# Patient Record
Sex: Female | Born: 1955
Health system: Southern US, Community
[De-identification: ages and names within clinical notes are randomized; demographics above are authoritative.]

## PROBLEM LIST (undated history)

## (undated) DIAGNOSIS — I10 Essential (primary) hypertension: Secondary | ICD-10-CM

## (undated) DIAGNOSIS — Z9889 Other specified postprocedural states: Secondary | ICD-10-CM

## (undated) DIAGNOSIS — E079 Disorder of thyroid, unspecified: Secondary | ICD-10-CM

## (undated) DIAGNOSIS — J45909 Unspecified asthma, uncomplicated: Secondary | ICD-10-CM

## (undated) DIAGNOSIS — E039 Hypothyroidism, unspecified: Secondary | ICD-10-CM

## (undated) DIAGNOSIS — R112 Nausea with vomiting, unspecified: Secondary | ICD-10-CM

## (undated) HISTORY — PX: BUNIONECTOMY: SHX129

## (undated) HISTORY — DX: Unspecified asthma, uncomplicated: J45.909

## (undated) HISTORY — PX: ABDOMINAL HYSTERECTOMY: SHX81

## (undated) HISTORY — PX: APPENDECTOMY: SHX54

---

## 2000-06-11 ENCOUNTER — Other Ambulatory Visit: Admission: RE | Admit: 2000-06-11 | Discharge: 2000-06-11 | Payer: Self-pay | Admitting: Obstetrics and Gynecology

## 2001-12-17 ENCOUNTER — Other Ambulatory Visit: Admission: RE | Admit: 2001-12-17 | Discharge: 2001-12-17 | Payer: Self-pay | Admitting: Unknown Physician Specialty

## 2004-02-04 ENCOUNTER — Emergency Department (HOSPITAL_COMMUNITY): Admission: EM | Admit: 2004-02-04 | Discharge: 2004-02-04 | Payer: Self-pay | Admitting: Family Medicine

## 2006-11-02 ENCOUNTER — Emergency Department (HOSPITAL_COMMUNITY): Admission: EM | Admit: 2006-11-02 | Discharge: 2006-11-02 | Payer: Self-pay | Admitting: Family Medicine

## 2007-04-15 ENCOUNTER — Ambulatory Visit: Payer: Self-pay | Admitting: Internal Medicine

## 2007-04-29 ENCOUNTER — Ambulatory Visit (HOSPITAL_COMMUNITY): Admission: RE | Admit: 2007-04-29 | Discharge: 2007-04-29 | Payer: Self-pay | Admitting: Internal Medicine

## 2007-04-29 ENCOUNTER — Encounter: Payer: Self-pay | Admitting: Internal Medicine

## 2007-04-29 ENCOUNTER — Ambulatory Visit: Payer: Self-pay | Admitting: Internal Medicine

## 2007-04-29 HISTORY — PX: OTHER SURGICAL HISTORY: SHX169

## 2009-07-04 ENCOUNTER — Ambulatory Visit (HOSPITAL_COMMUNITY): Admission: RE | Admit: 2009-07-04 | Discharge: 2009-07-04 | Payer: Self-pay | Admitting: Internal Medicine

## 2010-06-19 NOTE — Op Note (Signed)
Heather Raymond, Heather Raymond                ACCOUNT NO.:  [id number]   MEDICAL RECORD NO.:  [id number]          PATIENT TYPE:  AMB   LOCATION:  DAY                           FACILITY:  [facility]   PHYSICIAN:  R. Roetta Sessions, M.D. DATE OF BIRTH:  [birth year]   DATE OF PROCEDURE:  04/29/2007  DATE OF DISCHARGE:                               OPERATIVE REPORT   PROCEDURE:  Ileocolonoscopy with biopsy, snare polypectomy.   INDICATIONS FOR PROCEDURE:  A 55 year old lady with intermittent small-  volume hematochezia that is painless.  She has never had her lower GI  tract evaluated.  There is no family history of colorectal neoplasia.  Colonoscopy is now being done.  This approach has been discussed with  the patient at length, potential risks, benefits and alternatives have  been reviewed, questions answered.  She is agreeable.  Please see  documentation in the medical record.   PROCEDURE NOTE:  O2 saturation, blood pressure, pulse and respirations  were throughout entirety of the procedure.  Conscious sedation:  Versed  4 mg IV, Demerol 75 mg IV in divided doses. Instrument:  Pentax video  chip system.   FINDINGS:  Digital rectal exam revealed no abnormalities.  Endoscopic  findings:  The prep was adequate.   Colon:  The colonic mucosa was surveyed from the rectosigmoid junction  through the left, transverse and right colon to area of the appendiceal  orifice, ileocecal valve and cecum.  These structures were well-seen and  photographed for the record.  The terminal ileum was intubated to 810  cm.  From this level the scope was slowly withdrawn.  All previously-  mentioned mucosal surfaces were again seen.  The patient was noted have  a 2-mm polyp in the base the cecum, which was totally removed with cold  biopsy forceps and recovered for the pathologist.  The terminal ileum  appeared normal.  The patient was noted have left-sided diverticula.  There was a 6-mm flat polyp in the mid sigmoid colon,  which was removed  with hot snare cautery and recovered through the scope.  Remainder of  the colonic mucosa appeared normal.  The scope was pulled down into the  rectum, where a thorough examination of the rectal mucosa including an  en face view of the anal canal demonstrated some friable hemorrhoids  only.  The patient tolerated the procedure well, was reacted in  endoscopy.   IMPRESSION:  1. Friable anal canal hemorrhoids, otherwise normal rectum.  2. Left-sided diverticula.  3. Flat polyp, mid sigmoid colon, removed as described above.  4. Diminutive cecal polyp removed as described above.  5. Remainder of the colonic mucosa appeared normal.  6. Normal terminal ileum.   RECOMMENDATIONS:  1. Hemorrhoid, diverticulosis and polyp literature provided to [name].  Begin Anusol-HC suppositories one per rectum at bedtime      times 10 days.  2. Fiber supplement in the way of Benefiber 1 tablespoon daily.  3. Follow up on pathology.  4. Further recommendations to follow.      Jonathon Bellows, M.D.  Electronically Signed     RMR/MEDQ  D:  04/29/2007  T:  04/29/2007  Job:  [id number]   cc:   Catalina Pizza, M.D.  Fax: [phone]

## 2010-06-19 NOTE — Consult Note (Signed)
NAMEAGAPE, HARDIMAN                ACCOUNT NO.:  [id number]   MEDICAL RECORD NO.:  [id number]          PATIENT TYPE:  AMB   LOCATION:  DAY                           FACILITY:  [facility]   PHYSICIAN:  R. Roetta Sessions, M.D. DATE OF BIRTH:  1955/03/09   DATE OF CONSULTATION:  DATE OF DISCHARGE:                                 CONSULTATION   REQUESTING PHYSICIAN:  Catalina Pizza, M.D.   PRIMARY CARE PHYSICIAN:  R. Roetta Sessions, M.D.   CONSULTATIONS:  GI.   CHIEF COMPLAINT:  Intermittent rectal bleeding.   HISTORY OF PRESENT ILLNESS:  Ms. Buchinger is a 55 year old female who has  never had a colonoscopy.  She presented for triage and colonoscopy.  Upon further questioning, it was noted that she has been having  intermittent rectal bleeding.  She was then scheduled for office  consultation prior to procedure.  She notes small volume intermittent  rectal bleeding that has been going on for approximately 20 years.  She  notes it usually when she has hard stools.  She attributes this to  hemorrhoids.  She has had them since she was pregnant with her children  20+ years ago.  She denies any active bleeding at this time.  She denies  any abdominal pain, constipation or diarrhea.  Denies any nausea,  vomiting, heartburn or indigestion.   PAST MEDICAL/SURGICAL HISTORY:  1. History of hypertension.  2. Appendectomy in 2002.  3. She has had a partial hysterectomy in 2004.   CURRENT MEDICATIONS:  1. Benicar 20 mg daily.  2. Calcium and vitamin D once daily.   ALLERGIES:  NO KNOWN DRUG ALLERGIES.   FAMILY HISTORY:  She had a grandmother with gastric carcinoma.  Father  deceased at 65 secondary to multiple myeloma.  Mother age 14 has history  of osteoporosis.  She has 4 healthy siblings.   SOCIAL HISTORY:  Ms. Viramontes is married.  She lost 1 son secondary to an  accident.  She has 1 child who is alive and well.  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  She denies any tobacco,  alcohol or drug use.   REVIEW OF SYSTEMS:  See HPI, otherwise negative.   PHYSICAL EXAMINATION:  VITAL SIGNS:  Weight 154 pounds, height 63  inches, temperature 98.2, blood pressure 120/80, pulse 80.  GENERAL:  Ms. Hancock is a well-developed, well-nourished Caucasian female  in no acute distress.  HEENT:  Sclerae are clear and nonicteric.  Clear conjunctivae.  Oropharynx pink and moist without lesions.  NECK:  Supple without thyromegaly.  CHEST:  Heart regular rate and rhythm.  Normal S1-S2.  No rubs, murmurs,  clicks, rubs or gallops.  LUNGS:  Clear to auscultation bilaterally.  ABDOMEN:  Positive bowel sounds x4.  No bruits auscultated.  Soft,  nontender, nondistended without palpable mass or hepatosplenomegaly.  No  rebound tenderness or guarding.  EXTREMITIES:  Without clubbing or edema bilaterally.  RECTAL:  Deferred.   IMPRESSION:  Ms. Roe is a 55 year old female with intermittent small  volume rectal bleeding of a chronic nature felt to be  secondary to  hemorrhoids, however, she has never had a colonoscopy.  She is going to  need further evaluation to rule out colorectal carcinoma.   PLAN:  Colonoscopy with Dr. Jena Gauss in the future.  I have  discussed the  procedure including risks and benefits, including but not limited to  bleeding, infection, perforation, drug reaction.  She agrees with the  plan and consent will be obtained.   Thank you Dr. Margo Aye for allowing [place] to participate in the care of Ms.  Corbit.      Lorenza Burton, N.P.      Jonathon Bellows, M.D.  Electronically Signed    KJ/MEDQ  D:  04/15/2007  T:  04/16/2007  Job:  [id number]   cc:   Catalina Pizza, M.D.  Fax: [phone]

## 2010-09-10 ENCOUNTER — Ambulatory Visit (HOSPITAL_COMMUNITY)
Admission: RE | Admit: 2010-09-10 | Discharge: 2010-09-10 | Disposition: A | Discharge status: Home / Self Care | Payer: No Typology Code available for payment source | Source: Ambulatory Visit | Attending: Internal Medicine | Admitting: Internal Medicine

## 2010-09-10 ENCOUNTER — Other Ambulatory Visit (HOSPITAL_COMMUNITY): Payer: Self-pay | Admitting: Internal Medicine

## 2010-09-10 DIAGNOSIS — R072 Precordial pain: Secondary | ICD-10-CM | POA: Insufficient documentation

## 2010-11-15 LAB — POCT URINALYSIS DIP (DEVICE)
Bilirubin Urine: NEGATIVE
Ketones, ur: NEGATIVE
Nitrite: NEGATIVE
Operator id: 235561
Protein, ur: 30 — AB
Specific Gravity, Urine: 1.01

## 2010-11-15 LAB — URINE CULTURE

## 2011-12-17 ENCOUNTER — Emergency Department (HOSPITAL_COMMUNITY)
Admission: EM | Admit: 2011-12-17 | Discharge: 2011-12-17 | Disposition: A | Discharge status: Home / Self Care | Payer: BC Managed Care – PPO | Attending: Emergency Medicine | Admitting: Emergency Medicine

## 2011-12-17 ENCOUNTER — Encounter (HOSPITAL_COMMUNITY): Payer: Self-pay

## 2011-12-17 DIAGNOSIS — Z79899 Other long term (current) drug therapy: Secondary | ICD-10-CM | POA: Insufficient documentation

## 2011-12-17 DIAGNOSIS — R197 Diarrhea, unspecified: Secondary | ICD-10-CM | POA: Insufficient documentation

## 2011-12-17 DIAGNOSIS — R5383 Other fatigue: Secondary | ICD-10-CM | POA: Insufficient documentation

## 2011-12-17 DIAGNOSIS — R5381 Other malaise: Secondary | ICD-10-CM | POA: Insufficient documentation

## 2011-12-17 DIAGNOSIS — R112 Nausea with vomiting, unspecified: Secondary | ICD-10-CM | POA: Insufficient documentation

## 2011-12-17 DIAGNOSIS — I10 Essential (primary) hypertension: Secondary | ICD-10-CM | POA: Insufficient documentation

## 2011-12-17 DIAGNOSIS — E079 Disorder of thyroid, unspecified: Secondary | ICD-10-CM | POA: Insufficient documentation

## 2011-12-17 HISTORY — DX: Disorder of thyroid, unspecified: E07.9

## 2011-12-17 HISTORY — DX: Essential (primary) hypertension: I10

## 2011-12-17 LAB — CBC WITH DIFFERENTIAL/PLATELET
Eosinophils Absolute: 0 10*3/uL (ref 0.0–0.7)
Eosinophils Relative: 0 % (ref 0–5)
Hemoglobin: 14.6 g/dL (ref 12.0–15.0)
MCH: 30.9 pg (ref 26.0–34.0)
MCHC: 34 g/dL (ref 30.0–36.0)
Monocytes Absolute: 0.5 10*3/uL (ref 0.1–1.0)
Monocytes Relative: 5 % (ref 3–12)
Platelets: 244 10*3/uL (ref 150–400)
WBC: 10 10*3/uL (ref 4.0–10.5)

## 2011-12-17 LAB — BASIC METABOLIC PANEL
BUN: 13 mg/dL (ref 6–23)
GFR calc Af Amer: >90 mL/min (ref >90)
GFR calc non Af Amer: >90 mL/min (ref >90)
Glucose, Bld: 113 mg/dL — ABNORMAL HIGH (ref 70–99)
Sodium: 139 mEq/L (ref 135–145)

## 2011-12-17 MED ORDER — ONDANSETRON HCL 4 MG/2ML IJ SOLN
4.0000 mg | Freq: Once | INTRAMUSCULAR | Status: AC
  Administered 2011-12-17: 4 mg via INTRAVENOUS
  Filled 2011-12-17: qty 2

## 2011-12-17 MED ORDER — SODIUM CHLORIDE 0.9 % IV SOLN: Freq: Once | INTRAVENOUS | Status: DC

## 2011-12-17 MED ORDER — SODIUM CHLORIDE 0.9 % IV BOLUS (SEPSIS)
1000.0000 mL | Freq: Once | INTRAVENOUS | Status: AC
  Administered 2011-12-17: 1000 mL via INTRAVENOUS

## 2011-12-17 NOTE — ED Notes (Signed)
Pt reports woke up this morning with abd pain, n/v/d at 4 am.

## 2011-12-17 NOTE — ED Provider Notes (Signed)
History    This chart was scribed for Joya Gaskins, MD, MD by Smitty Pluck, ED Scribe. The patient was seen in room APA08 and the patient's care was started at 12:42PM.   CSN: [id number]  Arrival date & time 12/17/11  1138      Chief Complaint  Patient presents with  . Abdominal Pain  . Emesis  . Diarrhea     Patient is a 56 y.o. female presenting with abdominal pain, vomiting, and diarrhea. The history is provided by the patient. No language interpreter was used.  Abdominal Pain The primary symptoms of the illness include abdominal pain, vomiting and diarrhea. The primary symptoms of the illness do not include fever or shortness of breath. The problem has been gradually improving.  The abdominal pain began 6 to 12 hours ago. The pain came on suddenly. The abdominal pain is generalized. The abdominal pain does not radiate. The abdominal pain is relieved by nothing.  The vomiting began today.  The diarrhea began today. The diarrhea is watery. The diarrhea occurs more than 10 times per day.  Symptoms associated with the illness do not include chills.  Emesis  Associated symptoms include abdominal pain and diarrhea. Pertinent negatives include no chills, no cough, no fever and no headaches.  Diarrhea The primary symptoms include abdominal pain, vomiting and diarrhea. Primary symptoms do not include fever.  The illness does not include chills.   Heather Raymond is a 56 y.o. female who presents to the Emergency Department complaining of constant, moderate abdominal pain onset today 9 hours ago. Pt reports that she woke up with abdominal cramping, diarrhea 12x and vomiting. She reports that she had mild pain 3 days ago. Denies fevers, chills, blood in stool, abx usage, recent travel, cough, SOB, dietary changes, chest pain, back pain and syncope. She reports that she has generalized weakness. Reports abdominal pain has improved.   Past Medical History  Diagnosis Date  . Hypertension    . Thyroid disease     Past Surgical History  Procedure Date  . Abdominal hysterectomy   . Appendectomy     No family history on file.  History  Substance Use Topics  . Smoking status: Never Smoker   . Smokeless tobacco: Not on file  . Alcohol Use: No    OB History    Grav Para Term Preterm Abortions TAB SAB Ect Mult Living                  Review of Systems  Constitutional: Negative for fever and chills.  Respiratory: Negative for cough and shortness of breath.   Cardiovascular: Negative for chest pain.  Gastrointestinal: Positive for vomiting, abdominal pain and diarrhea.  Neurological: Positive for weakness. Negative for syncope and headaches.  All other systems reviewed and are negative.    Allergies  Sulfa antibiotics  Home Medications   Current Outpatient Rx  Name  Route  Sig  Dispense  Refill  . LEVOTHYROXINE SODIUM 25 MCG PO TABS   Oral   Take 25 mcg by mouth daily.         Marland Kitchen LORATADINE 10 MG PO TABS   Oral   Take 10 mg by mouth daily.         Jeralyn Bennett CALCIUM 500 MG PO TABS   Oral   Take 500 mg of elemental calcium by mouth 2 (two) times daily.         . TELMISARTAN 80 MG PO TABS   Oral  Take 80 mg by mouth daily.           BP 115/88  Pulse 89  Temp 98.4 F (36.9 C) (Oral)  Resp 18  Ht 5\' 3"  (1.6 m)  Wt 145 lb (65.772 kg)  BMI 25.69 kg/m2  SpO2 99%  Physical Exam  Nursing note and vitals reviewed. CONSTITUTIONAL: Well developed/well nourished HEAD AND FACE: Normocephalic/atraumatic EYES: EOMI/PERRL, no icterus ENMT: Mucous membranes dry NECK: supple no meningeal signs SPINE:entire spine nontender CV: S1/S2 noted, no murmurs/rubs/gallops noted LUNGS: Lungs are clear to auscultation bilaterally, no apparent distress ABDOMEN: soft, nontender, no rebound or guarding GU:no cva tenderness NEURO: Pt is awake/alert, moves all extremitiesx4 EXTREMITIES: pulses normal, full ROM SKIN: warm, color normal PSYCH: no  abnormalities of mood noted   ED Course  Procedures  DIAGNOSTIC STUDIES: Oxygen Saturation is 99% on room air, normal by my interpretation.    COORDINATION OF CARE: 12:45 PM Discussed ED treatment with pt   2:37 PM Pt improved,she is taking PO fluids.  Her abdomen is soft and nontender.  She reported both vomit and diarrhea, likely viral process.  Discussed strict return precautions    MDM  Nursing notes including past medical history and social history reviewed and considered in documentation Labs/vital reviewed and considered     I personally performed the services described in this documentation, which was scribed in my presence. The recorded information has been reviewed and is accurate.      Joya Gaskins, MD 12/17/11 1438

## 2011-12-17 NOTE — ED Notes (Signed)
Pt states awoke this morning at 0400 to abdominal cramping, diarrhea and vomiting. Pt states vomited x 3 today and several diarrhea episodes. Pt reports feeling fine yesterday.  Pt denies fever at this time.  However does report chills and body aches. Pt reports having a flu shot 1 week ago.

## 2012-02-26 ENCOUNTER — Encounter: Payer: Self-pay | Admitting: Internal Medicine

## 2012-04-02 ENCOUNTER — Encounter: Payer: Self-pay | Admitting: Internal Medicine

## 2012-04-06 ENCOUNTER — Ambulatory Visit: Payer: BC Managed Care – PPO | Admitting: Urgent Care

## 2012-04-28 ENCOUNTER — Ambulatory Visit: Payer: BC Managed Care – PPO | Admitting: Gastroenterology

## 2012-05-03 ENCOUNTER — Emergency Department (HOSPITAL_COMMUNITY)
Admission: EM | Admit: 2012-05-03 | Discharge: 2012-05-03 | Disposition: A | Discharge status: Home / Self Care | Payer: BC Managed Care – PPO | Source: Home / Self Care | Attending: Family Medicine | Admitting: Family Medicine

## 2012-05-03 ENCOUNTER — Encounter (HOSPITAL_COMMUNITY): Payer: Self-pay | Admitting: *Deleted

## 2012-05-03 DIAGNOSIS — J309 Allergic rhinitis, unspecified: Secondary | ICD-10-CM

## 2012-05-03 DIAGNOSIS — J302 Other seasonal allergic rhinitis: Secondary | ICD-10-CM

## 2012-05-03 MED ORDER — FLUTICASONE PROPIONATE 50 MCG/ACT NA SUSP: 1.0000 | Freq: Two times a day (BID) | NASAL | Status: DC

## 2012-05-03 MED ORDER — AZITHROMYCIN 250 MG PO TABS: ORAL_TABLET | ORAL | Status: DC

## 2012-05-03 NOTE — ED Notes (Signed)
C/O HAs, sinus pressure and drainage, dry cough x 3 days.  Felt feverish and had chills last night.  Has been taking Mucinex, Nyquil, and Tyl (last dose @ 1000).  BBS clear.  Denies any SOB.

## 2012-05-03 NOTE — ED Provider Notes (Signed)
History     CSN: [id number]  Arrival date & time 05/03/12  1100   First MD Initiated Contact with Patient 05/03/12 1103      Chief Complaint  Patient presents with  . Facial Pain  . Nasal Congestion    (Consider location/radiation/quality/duration/timing/severity/associated sxs/prior treatment) Patient is a 57 y.o. female presenting with URI. The history is provided by the patient.  URI Presenting symptoms: congestion, cough, facial pain, fever and rhinorrhea   Severity:  Mild Duration:  3 days Progression:  Unchanged Chronicity:  Recurrent Ineffective treatments:  Decongestant Associated symptoms: no wheezing   Risk factors comment:  Seasonal allergies   Past Medical History  Diagnosis Date  . Hypertension   . Thyroid disease     Past Surgical History  Procedure Laterality Date  . Abdominal hysterectomy    . Appendectomy    . Leocolonoscopy  04/29/2007    GNF:AOZHYQM anal canal hemorrhoids/Left-sided diverticula/Flat polyp, mid sigmoid colon  . Bunionectomy      March 2014    No family history on file.  History  Substance Use Topics  . Smoking status: Never Smoker   . Smokeless tobacco: Not on file  . Alcohol Use: No    OB History   Grav Para Term Preterm Abortions TAB SAB Ect Mult Living                  Review of Systems  Constitutional: Positive for fever and chills.  HENT: Positive for congestion, rhinorrhea and postnasal drip.   Respiratory: Positive for cough. Negative for shortness of breath and wheezing.   Cardiovascular: Negative.     Allergies  Sulfa antibiotics  Home Medications   Current Outpatient Rx  Name  Route  Sig  Dispense  Refill  . Calcium Carbonate-Vitamin D (CALCIUM + D PO)   Oral   Take by mouth.         . levothyroxine (SYNTHROID, LEVOTHROID) 25 MCG tablet   Oral   Take 25 mcg by mouth daily.         Marland Kitchen telmisartan (MICARDIS) 80 MG tablet   Oral   Take 80 mg by mouth daily.         Marland Kitchen azithromycin  (ZITHROMAX Z-PAK) 250 MG tablet      Take as directed on pack   6 each   0   . fluticasone (FLONASE) 50 MCG/ACT nasal spray   Nasal   Place 1 spray into the nose 2 (two) times daily.   1 g   2   . loratadine (CLARITIN) 10 MG tablet   Oral   Take 10 mg by mouth daily.         Ethelda Chick (OYSTER CALCIUM) 500 MG TABS   Oral   Take 500 mg of elemental calcium by mouth 2 (two) times daily.           BP 114/78  Pulse 104  Temp(Src) 98.2 F (36.8 C) (Oral)  Resp 18  SpO2 95%  Physical Exam  Nursing note and vitals reviewed. Constitutional: She is oriented to person, place, and time. She appears well-developed and well-nourished.  HENT:  Head: Normocephalic.  Right Ear: External ear normal.  Left Ear: External ear normal.  Nose: Mucosal edema and rhinorrhea present.  Mouth/Throat: Oropharynx is clear and moist.  Neck: Normal range of motion. Neck supple.  Cardiovascular: Normal rate, regular rhythm, normal heart sounds and intact distal pulses.   Pulmonary/Chest: Effort normal and breath sounds normal.  Lymphadenopathy:    She has no cervical adenopathy.  Neurological: She is alert and oriented to person, place, and time.  Skin: Skin is warm and dry.    ED Course  Procedures (including critical care time)  Labs Reviewed - No data to display No results found.   1. Seasonal allergic rhinitis       MDM          Linna Hoff, MD 05/05/12 2047

## 2012-06-01 ENCOUNTER — Other Ambulatory Visit: Payer: Self-pay

## 2012-06-01 ENCOUNTER — Ambulatory Visit (INDEPENDENT_AMBULATORY_CARE_PROVIDER_SITE_OTHER): Payer: BC Managed Care – PPO | Admitting: Gastroenterology

## 2012-06-01 ENCOUNTER — Encounter: Payer: Self-pay | Admitting: Gastroenterology

## 2012-06-01 VITALS — BP 128/76 | HR 80 | Temp 97.2°F | Ht 64.0 in | Wt 148.4 lb

## 2012-06-01 DIAGNOSIS — Z1211 Encounter for screening for malignant neoplasm of colon: Secondary | ICD-10-CM

## 2012-06-01 DIAGNOSIS — Z86010 Personal history of colonic polyps: Secondary | ICD-10-CM

## 2012-06-01 MED ORDER — PEG-KCL-NACL-NASULF-NA ASC-C 100 G PO SOLR: 1.0000 | ORAL | Status: DC

## 2012-06-01 NOTE — Addendum Note (Signed)
Addended by: Lavena Bullion on: 06/01/2012 11:59 AM   Modules accepted: Orders

## 2012-06-01 NOTE — Patient Instructions (Addendum)
We have scheduled you for a colonoscopy. Please see separate instructions. 

## 2012-06-01 NOTE — Progress Notes (Signed)
Primary Care Physician:  HALL,ZACK, MD  Primary Gastroenterologist:  Michael Rourk, MD   Chief Complaint  Patient presents with  . Colonoscopy    HPI:  Heather Raymond is a 56 y.o. female here to schedule surveillance colonoscopy. In 2009 she had a tubular adenoma removed from the cecum. BM better on levothyroxine. No melena. Some brbpr with norovirus back in the fall. Due to hemorrhoids. Hemoglobin normal at that time. No abdominal pain. Good appetite. No heartburn, dysphagia. No weight loss.   Current Outpatient Prescriptions  Medication Sig Dispense Refill  . Calcium Carbonate-Vitamin D (CALCIUM + D PO) Take by mouth.      . fluticasone (FLONASE) 50 MCG/ACT nasal spray Place 1 spray into the nose 2 (two) times daily.  1 g  2  . levothyroxine (SYNTHROID, LEVOTHROID) 25 MCG tablet Take 25 mcg by mouth daily.      . loratadine (CLARITIN) 10 MG tablet Take 10 mg by mouth daily.      . telmisartan (MICARDIS) 80 MG tablet Take 80 mg by mouth daily.       No current facility-administered medications for this visit.    Allergies as of 06/01/2012 - Review Complete 06/01/2012  Allergen Reaction Noted  . Sulfa antibiotics  12/17/2011    Past Medical History  Diagnosis Date  . Hypertension   . Thyroid disease     Past Surgical History  Procedure Laterality Date  . Abdominal hysterectomy    . Appendectomy    . Leocolonoscopy  04/29/2007    RMR:Friable anal canal hemorrhoids/Left-sided diverticula/Flat polyp, mid sigmoid colon (hyperplastic), diminutive cecal polyp (tubular adenoma)  . Bunionectomy      March 2014    Family History  Problem Relation Age of Onset  . Gastric cancer Other     Grandmother  . Multiple myeloma Father     Deceased at age 77  . Colon cancer Neg Hx     History   Social History  . Marital Status: Married    Spouse Name: [name]    Number of Children: [name]  . Years of Education: [name]   Occupational History  . Retired clerk of court    Social  History Main Topics  . Smoking status: Never Smoker   . Smokeless tobacco: Not on file  . Alcohol Use: No  . Drug Use: No  . Sexually Active: Not on file   Other Topics Concern  . Not on file   Social History Narrative   One child living. One son deceased secondary to accident.      ROS:  General: Negative for anorexia, weight loss, fever, chills, fatigue, weakness. Eyes: Negative for vision changes.  ENT: Negative for hoarseness, difficulty swallowing , nasal congestion. CV: Negative for chest pain, angina, palpitations, dyspnea on exertion, peripheral edema.  Respiratory: Negative for dyspnea at rest, dyspnea on exertion, cough, sputum, wheezing.  GI: See history of present illness. GU:  Negative for dysuria, hematuria, urinary incontinence, urinary frequency, nocturnal urination.  MS: Negative for joint pain, low back pain. Left foot swelling since bunionectomy Derm: Negative for rash or itching.  Neuro: Negative for weakness, abnormal sensation, seizure, frequent headaches, memory loss, confusion.  Psych: Negative for anxiety, depression, suicidal ideation, hallucinations.  Endo: Negative for unusual weight change.  Heme: Negative for bruising or bleeding. Allergy: Negative for rash or hives.    Physical Examination:  BP 128/76  Pulse 80  Temp(Src) 97.2 F (36.2 C) (Oral)  Ht 5' 4" (1.626 m)    Wt 148 lb 6.4 oz (67.314 kg)  BMI 25.46 kg/m2   General: Well-nourished, well-developed in no acute distress.  Head: Normocephalic, atraumatic.   Eyes: Conjunctiva pink, no icterus. Mouth: Oropharyngeal mucosa moist and pink , no lesions erythema or exudate. Neck: Supple without thyromegaly, masses, or lymphadenopathy.  Lungs: Clear to auscultation bilaterally.  Heart: Regular rate and rhythm, no murmurs rubs or gallops.  Abdomen: Bowel sounds are normal, nontender, nondistended, no hepatosplenomegaly or masses, no abdominal bruits or    hernia , no rebound or guarding.    Rectal: Deferred Extremities: No lower extremity edema. No clubbing or deformities.  Neuro: Alert and oriented x 4 , grossly normal neurologically.  Skin: Warm and dry, no rash or jaundice.   Psych: Alert and cooperative, normal mood and affect.  Labs:  Lab Results  Component Value Date   WBC 10.0 12/17/2011   HGB 14.6 12/17/2011   HCT 42.9 12/17/2011   MCV 90.7 12/17/2011   PLT 244 12/17/2011   Lab Results  Component Value Date   CREATININE 0.62 12/17/2011   BUN 13 12/17/2011   NA 139 12/17/2011   K 3.9 12/17/2011   CL 103 12/17/2011   CO2 26 12/17/2011    Imaging Studies: No results found.    

## 2012-06-01 NOTE — Progress Notes (Signed)
Cc PCP 

## 2012-06-01 NOTE — Assessment & Plan Note (Addendum)
57 year old lady with history of tubular adenoma removed from the cecum back in 2009 who presents for surveillance colonoscopy. No GI symptoms at this time. Colonoscopy in the near future with Dr. Jena Gauss.  I have discussed the risks, alternatives, benefits with regards to but not limited to the risk of reaction to medication, bleeding, infection, perforation and the patient is agreeable to proceed. Written consent to be obtained.

## 2012-06-11 ENCOUNTER — Encounter (HOSPITAL_COMMUNITY): Payer: Self-pay | Admitting: Pharmacy Technician

## 2012-06-30 ENCOUNTER — Encounter (HOSPITAL_COMMUNITY): Payer: Self-pay | Admitting: *Deleted

## 2012-06-30 ENCOUNTER — Ambulatory Visit (HOSPITAL_COMMUNITY)
Admission: RE | Admit: 2012-06-30 | Discharge: 2012-06-30 | Disposition: A | Discharge status: Home / Self Care | Payer: BC Managed Care – PPO | Source: Ambulatory Visit | Attending: Internal Medicine | Admitting: Internal Medicine

## 2012-06-30 ENCOUNTER — Encounter (HOSPITAL_COMMUNITY)
Admission: RE | Disposition: A | Discharge status: Home / Self Care | Payer: Self-pay | Source: Ambulatory Visit | Attending: Internal Medicine

## 2012-06-30 DIAGNOSIS — K5730 Diverticulosis of large intestine without perforation or abscess without bleeding: Secondary | ICD-10-CM | POA: Insufficient documentation

## 2012-06-30 DIAGNOSIS — D126 Benign neoplasm of colon, unspecified: Secondary | ICD-10-CM | POA: Insufficient documentation

## 2012-06-30 DIAGNOSIS — Z1211 Encounter for screening for malignant neoplasm of colon: Secondary | ICD-10-CM

## 2012-06-30 DIAGNOSIS — Z860100 Personal history of colon polyps, unspecified: Secondary | ICD-10-CM | POA: Insufficient documentation

## 2012-06-30 DIAGNOSIS — I10 Essential (primary) hypertension: Secondary | ICD-10-CM | POA: Insufficient documentation

## 2012-06-30 HISTORY — PX: COLONOSCOPY: SHX5424

## 2012-06-30 SURGERY — COLONOSCOPY
Anesthesia: Moderate Sedation

## 2012-06-30 MED ORDER — MIDAZOLAM HCL 5 MG/5ML IJ SOLN
INTRAMUSCULAR | Status: AC
  Filled 2012-06-30: qty 10

## 2012-06-30 MED ORDER — SODIUM CHLORIDE 0.9 % IV SOLN
INTRAVENOUS | Status: DC
  Administered 2012-06-30: 08:00:00 via INTRAVENOUS

## 2012-06-30 MED ORDER — MEPERIDINE HCL 100 MG/ML IJ SOLN
INTRAMUSCULAR | Status: DC | PRN
  Administered 2012-06-30 (×2): 50 mg via INTRAVENOUS

## 2012-06-30 MED ORDER — STERILE WATER FOR IRRIGATION IR SOLN
Status: DC | PRN
  Administered 2012-06-30: 08:00:00

## 2012-06-30 MED ORDER — MIDAZOLAM HCL 5 MG/5ML IJ SOLN
INTRAMUSCULAR | Status: DC | PRN
  Administered 2012-06-30 (×2): 2 mg via INTRAVENOUS

## 2012-06-30 MED ORDER — MEPERIDINE HCL 100 MG/ML IJ SOLN
INTRAMUSCULAR | Status: AC
  Filled 2012-06-30: qty 1

## 2012-06-30 MED ORDER — ONDANSETRON HCL 4 MG/2ML IJ SOLN
INTRAMUSCULAR | Status: DC | PRN
  Administered 2012-06-30: 4 mg via INTRAVENOUS

## 2012-06-30 MED ORDER — ONDANSETRON HCL 4 MG/2ML IJ SOLN
INTRAMUSCULAR | Status: AC
  Filled 2012-06-30: qty 2

## 2012-06-30 NOTE — Op Note (Signed)
[facility] [address] [place], [address]   COLONOSCOPY PROCEDURE REPORT  PATIENT: Heather Raymond, Heather Raymond  MR#:         [id number] BIRTHDATE: 18-Sep-1955 , 56  yrs. old GENDER: Female ENDOSCOPIST: R.  Roetta Sessions, MD FACP FACG REFERRED BY:  Catalina Pizza, M.D. PROCEDURE DATE:  06/30/2012 PROCEDURE:     Colonoscopy with snare polypectomy  INDICATIONS: History of colonic adenoma  INFORMED CONSENT:  The risks, benefits, alternatives and imponderables including but not limited to bleeding, perforation as well as the possibility of a missed lesion have been reviewed.  The potential for biopsy, lesion removal, etc. have also been discussed.  Questions have been answered.  All parties agreeable. Please see the history and physical in the medical record for more information.  MEDICATIONS: Versed 4 mg IV and Demerol 100 mg IV in divided doses. Zofran 4 mg IV  DESCRIPTION OF PROCEDURE:  After a digital rectal exam was performed, the EC-[id number] ([id number])  colonoscope was advanced from the anus through the rectum and colon to the area of the cecum, ileocecal valve and appendiceal orifice.  The cecum was deeply intubated.  These structures were well-seen and photographed for the record.  From the level of the cecum and ileocecal valve, the scope was slowly and cautiously withdrawn.  The mucosal surfaces were carefully surveyed utilizing scope tip deflection to facilitate fold flattening as needed.  The scope was pulled down into the rectum where a thorough examination including retroflexion was performed.    FINDINGS:  Adequate preparation. Normal rectum. Scattered left-sided diverticula. (1) 4 mm polyp in the ascending segment; otherwise, the remainder of the clot has appeared normal.  THERAPEUTIC / DIAGNOSTIC MANEUVERS PERFORMED:  The above-mentioned polyp was cold snare removed  COMPLICATIONS: None  CECAL WITHDRAWAL TIME:  9 minutes  IMPRESSION:  Colonic  diverticulosis. Colonic polyp-removed as described above  RECOMMENDATIONS: Followup on pathology   _______________________________ eSigned:  R. Roetta Sessions, MD FACP [facility] 06/30/2012 9:14 AM   CC:

## 2012-06-30 NOTE — H&P (View-Only) (Signed)
Primary Care Physician:  Dwana Melena, MD  Primary Gastroenterologist:  Roetta Sessions, MD   Chief Complaint  Patient presents with  . Colonoscopy    HPI:  Heather Raymond is a 57 y.o. female here to schedule surveillance colonoscopy. In 2009 she had a tubular adenoma removed from the cecum. BM better on levothyroxine. No melena. Some brbpr with norovirus back in the fall. Due to hemorrhoids. Hemoglobin normal at that time. No abdominal pain. Good appetite. No heartburn, dysphagia. No weight loss.   Current Outpatient Prescriptions  Medication Sig Dispense Refill  . Calcium Carbonate-Vitamin D (CALCIUM + D PO) Take by mouth.      . fluticasone (FLONASE) 50 MCG/ACT nasal spray Place 1 spray into the nose 2 (two) times daily.  1 g  2  . levothyroxine (SYNTHROID, LEVOTHROID) 25 MCG tablet Take 25 mcg by mouth daily.      Marland Kitchen loratadine (CLARITIN) 10 MG tablet Take 10 mg by mouth daily.      Marland Kitchen telmisartan (MICARDIS) 80 MG tablet Take 80 mg by mouth daily.       No current facility-administered medications for this visit.    Allergies as of 06/01/2012 - Review Complete 06/01/2012  Allergen Reaction Noted  . Sulfa antibiotics  12/17/2011    Past Medical History  Diagnosis Date  . Hypertension   . Thyroid disease     Past Surgical History  Procedure Laterality Date  . Abdominal hysterectomy    . Appendectomy    . Leocolonoscopy  04/29/2007    FAO:ZHYQMVH anal canal hemorrhoids/Left-sided diverticula/Flat polyp, mid sigmoid colon (hyperplastic), diminutive cecal polyp (tubular adenoma)  . Bunionectomy      March 2014    Family History  Problem Relation Age of Onset  . Gastric cancer Other     Grandmother  . Multiple myeloma Father     Deceased at age 42  . Colon cancer Neg Hx     History   Social History  . Marital Status: Married    Spouse Name: [name]    Number of Children: [name]  . Years of Education: [name]   Occupational History  . Retired Solicitor of court    Social  History Main Topics  . Smoking status: Never Smoker   . Smokeless tobacco: Not on file  . Alcohol Use: No  . Drug Use: No  . Sexually Active: Not on file   Other Topics Concern  . Not on file   Social History Narrative   One child living. One son deceased secondary to accident.      ROS:  General: Negative for anorexia, weight loss, fever, chills, fatigue, weakness. Eyes: Negative for vision changes.  ENT: Negative for hoarseness, difficulty swallowing , nasal congestion. CV: Negative for chest pain, angina, palpitations, dyspnea on exertion, peripheral edema.  Respiratory: Negative for dyspnea at rest, dyspnea on exertion, cough, sputum, wheezing.  GI: See history of present illness. GU:  Negative for dysuria, hematuria, urinary incontinence, urinary frequency, nocturnal urination.  MS: Negative for joint pain, low back pain. Left foot swelling since bunionectomy Derm: Negative for rash or itching.  Neuro: Negative for weakness, abnormal sensation, seizure, frequent headaches, memory loss, confusion.  Psych: Negative for anxiety, depression, suicidal ideation, hallucinations.  Endo: Negative for unusual weight change.  Heme: Negative for bruising or bleeding. Allergy: Negative for rash or hives.    Physical Examination:  BP 128/76  Pulse 80  Temp(Src) 97.2 F (36.2 C) (Oral)  Ht 5\' 4"  (1.626 m)  Wt 148 lb 6.4 oz (67.314 kg)  BMI 25.46 kg/m2   General: Well-nourished, well-developed in no acute distress.  Head: Normocephalic, atraumatic.   Eyes: Conjunctiva pink, no icterus. Mouth: Oropharyngeal mucosa moist and pink , no lesions erythema or exudate. Neck: Supple without thyromegaly, masses, or lymphadenopathy.  Lungs: Clear to auscultation bilaterally.  Heart: Regular rate and rhythm, no murmurs rubs or gallops.  Abdomen: Bowel sounds are normal, nontender, nondistended, no hepatosplenomegaly or masses, no abdominal bruits or    hernia , no rebound or guarding.    Rectal: Deferred Extremities: No lower extremity edema. No clubbing or deformities.  Neuro: Alert and oriented x 4 , grossly normal neurologically.  Skin: Warm and dry, no rash or jaundice.   Psych: Alert and cooperative, normal mood and affect.  Labs:  Lab Results  Component Value Date   WBC 10.0 12/17/2011   HGB 14.6 12/17/2011   HCT 42.9 12/17/2011   MCV 90.7 12/17/2011   PLT 244 12/17/2011   Lab Results  Component Value Date   CREATININE 0.62 12/17/2011   BUN 13 12/17/2011   NA 139 12/17/2011   K 3.9 12/17/2011   CL 103 12/17/2011   CO2 26 12/17/2011    Imaging Studies: No results found.

## 2012-06-30 NOTE — Interval H&P Note (Signed)
History and Physical Interval Note:  06/30/2012 8:26 AM  Heather Raymond  has presented today for surgery, with the diagnosis of Hx of colonic polyps  The various methods of treatment have been discussed with the patient and family. After consideration of risks, benefits and other options for treatment, the patient has consented to  Procedure(s) with comments: COLONOSCOPY ([name]) - 8:30 AM as a surgical intervention .  The patient's history has been reviewed, patient examined, no change in status, stable for surgery.  I have reviewed the patient's chart and labs.  Questions were answered to the patient's satisfaction.     Heather Raymond  Colonoscopy per plan.The risks, benefits, limitations, alternatives and imponderables have been reviewed with the patient. Questions have been answered. All parties are agreeable.

## 2012-07-02 ENCOUNTER — Encounter (HOSPITAL_COMMUNITY): Payer: Self-pay | Admitting: Internal Medicine

## 2012-07-02 ENCOUNTER — Encounter: Payer: Self-pay | Admitting: Internal Medicine

## 2013-08-16 ENCOUNTER — Other Ambulatory Visit (HOSPITAL_COMMUNITY): Payer: Self-pay | Admitting: Internal Medicine

## 2013-08-16 DIAGNOSIS — R1011 Right upper quadrant pain: Secondary | ICD-10-CM

## 2013-08-18 ENCOUNTER — Ambulatory Visit (HOSPITAL_COMMUNITY)
Admission: RE | Admit: 2013-08-18 | Discharge: 2013-08-18 | Disposition: A | Discharge status: Home / Self Care | Payer: BC Managed Care – PPO | Source: Ambulatory Visit | Attending: Internal Medicine | Admitting: Internal Medicine

## 2013-08-18 DIAGNOSIS — R1011 Right upper quadrant pain: Secondary | ICD-10-CM | POA: Insufficient documentation

## 2013-11-04 ENCOUNTER — Ambulatory Visit (INDEPENDENT_AMBULATORY_CARE_PROVIDER_SITE_OTHER): Payer: BC Managed Care – PPO | Admitting: Internal Medicine

## 2013-11-04 ENCOUNTER — Encounter: Payer: Self-pay | Admitting: Internal Medicine

## 2013-11-04 VITALS — BP 123/82 | HR 77 | Temp 97.3°F | Ht 63.0 in | Wt 148.8 lb

## 2013-11-04 DIAGNOSIS — R1010 Upper abdominal pain, unspecified: Secondary | ICD-10-CM

## 2013-11-04 DIAGNOSIS — Z86010 Personal history of colonic polyps: Secondary | ICD-10-CM | POA: Insufficient documentation

## 2013-11-04 DIAGNOSIS — Z860101 Personal history of adenomatous and serrated colon polyps: Secondary | ICD-10-CM | POA: Insufficient documentation

## 2013-11-04 DIAGNOSIS — G8929 Other chronic pain: Secondary | ICD-10-CM

## 2013-11-04 DIAGNOSIS — R1011 Right upper quadrant pain: Principal | ICD-10-CM

## 2013-11-04 MED ORDER — OMEPRAZOLE 20 MG PO CPDR: 20.0000 mg | DELAYED_RELEASE_CAPSULE | Freq: Every day | ORAL | Status: DC

## 2013-11-04 NOTE — Patient Instructions (Signed)
Continue Omeprazole 20 mg daily (Rx #30 with 3 refills)  Office visit  In 3 months  Call if any interim problems

## 2013-11-04 NOTE — Progress Notes (Signed)
Primary Care Physician:  Delphina Cahill, MD Primary Gastroenterologist:  Dr.   Pre-Procedure History & Physical: HPI:  Heather Raymond is a 58 y.o. female here for evaluation of 3 episodes of right upper quadrant abdominal pain occurring somewhat postprandially since May of this year. Some associated dizziness. No nausea vomiting or fever. No change in bowel habits. Symptoms last less than a day. CBC,  LFTs and gallbladder ultrasound negative recently through Dr. Juel Burrow office. Was started on omeprazole 20 mg daily empirically 1 month ago. She has done well without any recurrent symptoms since that time. No nonsteroidals. No history of peptic ulcer disease. She has not experienced any weight loss. She denies early satiety, odynophagia, dysphagia, typical GERD symptoms or change in bowel habits. Father had gallstones. Brother had gallstones. No family history of GI malignancy.   She does not use nonsteroidals. Colonic adenoma removed from her colon a colonoscopy last year.  Past Medical History  Diagnosis Date  . Hypertension   . Thyroid disease     Past Surgical History  Procedure Laterality Date  . Abdominal hysterectomy    . Appendectomy    . Leocolonoscopy  04/29/2007    WKG:SUPJSRP anal canal hemorrhoids/Left-sided diverticula/Flat polyp, mid sigmoid colon (hyperplastic), diminutive cecal polyp (tubular adenoma)  . Bunionectomy      March 2014  . Colonoscopy [name] 06/30/2012    Dr.Randilyn Foisy- adequate prep, normal rectum, scattered left-sided divertiucula 72m polyp in the ascending segment o/w the remainder of the colon appeared normal. bx=benign lymphoid polyp    Prior to Admission medications   Medication Sig Start Date End Date Taking? Authorizing Provider  Calcium Carbonate-Vitamin D (CALCIUM + D PO) Take 1 tablet by mouth daily.    Yes Historical Provider, MD  fexofenadine (ALLEGRA) 180 MG tablet Take 180 mg by mouth daily.   Yes Historical Provider, MD  levothyroxine (SYNTHROID,  LEVOTHROID) 25 MCG tablet Take 50 mcg by mouth daily.    Yes Historical Provider, MD  omeprazole (PRILOSEC) 20 MG capsule Take 20 mg by mouth daily.   Yes Historical Provider, MD  telmisartan (MICARDIS) 80 MG tablet Take 80 mg by mouth daily.   Yes Historical Provider, MD  fluticasone (FLONASE) 50 MCG/ACT nasal spray Place 1 spray into the nose 2 (two) times daily. 05/03/12   JBilly Fischer MD  loratadine (CLARITIN) 10 MG tablet Take 10 mg by mouth daily.    Historical Provider, MD    Allergies as of 11/04/2013 - Review Complete 11/04/2013  Allergen Reaction Noted  . Sulfa antibiotics  12/17/2011    Family History  Problem Relation Age of Onset  . Gastric cancer Other     Grandmother  . Multiple myeloma Father     Deceased at age 60 . Colon cancer Neg Hx     History   Social History  . Marital Status: Married    Spouse Name: [name]    Number of Children: [name]  . Years of Education: [name]   Occupational History  . Retired cScientist, clinical (histocompatibility and immunogenetics)of court    Social History Main Topics  . Smoking status: Never Smoker   . Smokeless tobacco: Not on file  . Alcohol Use: No  . Drug Use: No  . Sexual Activity: Not on file   Other Topics Concern  . Not on file   Social History Narrative   One child living. One son deceased secondary to accident.    Review of Systems: See HPI, otherwise negative ROS  Physical  Exam: BP 123/82  Pulse 77  Temp(Src) 97.3 F (36.3 C) (Oral)  Ht 5' 3"  (1.6 m)  Wt 148 lb 12.8 oz (67.495 kg)  BMI 26.37 kg/m2 General:   Alert,  Well-developed, well-nourished, pleasant and cooperative in NAD Skin:  Intact without significant lesions or rashes. Eyes:  Sclera clear, no icterus.   Conjunctiva pink. Ears:  Normal auditory acuity. Nose:  No deformity, discharge,  or lesions. Mouth:  No deformity or lesions. Neck:  Supple; no masses or thyromegaly. No significant cervical adenopathy. Lungs:  Clear throughout to auscultation.   No wheezes, crackles, or rhonchi. No acute  distress. Heart:  Regular rate and rhythm; no murmurs, clicks, rubs,  or gallops. Abdomen: Non-distended, normal bowel sounds.  Soft and nontender without appreciable mass or hepatosplenomegaly.  Pulses:  Normal pulses noted. Extremities:  Without clubbing or edema.   Impression:  Pleasant 58 year old lady with 3 episodes of somewhat postprandial right upper quadrant abdominal pain since May of this year. Her symptoms lasted less than a day. Some association with dizziness. No vomiting, fever or change in bowel habits. Gallbladder ultrasound negative. CBC and LFTs normal.  She has done well on omeprazole for the past month. It remains to be seen whether or not omeprazole has had any beneficial effect. Symptoms would be somewhat atypical for acid peptic disease. I am impressed that her symptoms sound more biliary than anything else. Certainly, no alarm features. No need for EGD or further studies at this time.  Recommendations:   Continue omeprazole 20 mg daily. We'll plan to see her back in 3 months. If she has any interim attacks, she is to let me know. I would recommend we pursue a HIDA if this were to occur. Her questions have been answered. As a totally separate issue, she'll be due due for surveillance colonoscopy in 4 years given her history of colonic adenoma.         Notice: This dictation was prepared with Dragon dictation along with smaller phrase technology. Any transcriptional errors that result from this process are unintentional and may not be corrected upon review.

## 2014-02-01 ENCOUNTER — Encounter: Payer: Self-pay | Admitting: Internal Medicine

## 2015-12-05 ENCOUNTER — Encounter: Payer: Self-pay | Admitting: Allergy & Immunology

## 2015-12-05 ENCOUNTER — Ambulatory Visit (INDEPENDENT_AMBULATORY_CARE_PROVIDER_SITE_OTHER): Payer: BC Managed Care – PPO | Admitting: Allergy & Immunology

## 2015-12-05 VITALS — BP 120/90 | HR 75 | Temp 97.8°F | Resp 16 | Ht 62.52 in | Wt 150.6 lb

## 2015-12-05 DIAGNOSIS — T7819XD Other adverse food reactions, not elsewhere classified, subsequent encounter: Secondary | ICD-10-CM

## 2015-12-05 DIAGNOSIS — T781XXD Other adverse food reactions, not elsewhere classified, subsequent encounter: Secondary | ICD-10-CM | POA: Diagnosis not present

## 2015-12-05 DIAGNOSIS — J310 Chronic rhinitis: Secondary | ICD-10-CM | POA: Diagnosis not present

## 2015-12-05 DIAGNOSIS — J4530 Mild persistent asthma, uncomplicated: Secondary | ICD-10-CM

## 2015-12-05 NOTE — Progress Notes (Signed)
NEW PATIENT  Date of Service/Encounter:  12/05/15   Assessment:   Mild persistent asthma, uncomplicated - Plan: Spirometry with Graph  Adverse food reaction, subsequent encounter - Plan: Allergy Test, Allergen, Coriander/Cilantro IgE, [id number] Cumin  Chronic rhinitis, unspecified type - Plan: Allergy Test   Asthma Reportables:  Severity: mild persistent  Risk: low Control: not well controlled  Seasonal Influenza Vaccine: no but encouraged    Plan/Recommendations:   1. Mild persistent asthma, uncomplicated - Lung function testing showed evidence of obstruction (asthma) today. - Coughing three nights per week may be an indication of uncontrolled asthma. - We will start Qvar today (sample provided - call us if this is decreasing your coughing and we will send in a prescription). - Daily controller medication(s): Qvar 43mg two puffs twice daily with SPACER - Rescue medications: albuterol 4 puffs every 4-6 hours as needed - Changes during respiratory infections or worsening symptoms: increase Qvar 821m to 4 puffs once in the morning and once at night for TWO WEEKS. - Asthma control goals:  * Full participation in all desired activities (may need albuterol before activity) * Albuterol use two time or less a week on average (not counting use with activity) * Cough interfering with sleep two time or less a month * Oral steroids no more than once a year * No hospitalizations  2. Adverse food reaction (unknown, possible cilantro or cumin) - Testing today was negative to tomato, onion, black peeper, and garlic. - We will send blood work for ciGooglend cumin today. - Symptoms might be more related to the "scent" of the foods, which might trigger your coughing/wheezing (similar to the hairspray and cleaning spray). - We will call you with the results.  3. Chronic rhinitis, unspecified type - Testing today showed: positives to weeds, trees, grasses, molds, dust mite, cockroach,  horse, and mouse - Continue with Rhinocort 1-2 sprays per nostril daily. - Continue with alternating Allegra and Zyrtec daily. - Consider allergy shots in the future. - Avoidance measures provided.   4. Return in about 4 weeks (around 01/02/2016).     Subjective:   Heather AMADIs a [age].o. female presenting today for evaluation of  Chief Complaint  Patient presents with  . Allergy Testing  .  Heather BOUTELLEas a history of the following: Patient Active Problem List   Diagnosis Date Noted  . Abdominal pain, chronic, right upper quadrant 11/04/2013  . Hx of adenomatous colonic polyps 11/04/2013  . Encounter for colonoscopy due to history of adenomatous colonic polyps 06/01/2012    History obtained from: chart review and patient.  Heather Limboas referred by ZaWende NeighborsMD.     Heather Raymond a [age].o. female presenting for allergy testing. She is interested in looking into allergy testing for foods. Specifically she gets a tickle in her throat with certain salsas. She has been on antihistamines since October 27th. The patient first noted the symptoms around two years ago. Symptoms include nearly immediate coughing, wheezing, and tearing. She drinks lots of water to help the symptoms. She has not treated with benadryl or other antihistamines to help with the symptoms. Symptoms last around five minutes in duration. She has never needed to go to ED for her symptoms. EMS was never called.   She does eat tomatoes in other forms. She does not eat cilantro. She does eat onions and peppers without a problem. She uses cumin in other dishes without an issue. She also uses  garlic without a problem. She does tolerate the spicy seasoning in [employer] chicken. She has purchased salsas and has done fine with those. She tolerates all of the major food allergens.   Heather Raymond does have a history of allergic rhinitis. She has never had allergy testing performed. She takes cetirizine one tablet daily or  Allegra. She notices that she needs to change because one seems to be less effective. She has done this for around 20 years. She is using Rhinocort which seems to work well. She has used Flonase as well with some improvement. Symptoms are worse around pollen or with mowing the yard.   Heather Raymond does have an inhaler which she uses intermittently. The last time that she needed it was in the spring. She does endorse coughing around three nights per week. Some aerosol spray (in her bathroom) seem to make her coughing worse. Other perfumes can cause the coughing as well. She has never needed steroids for breathing. She has needed no hospitalizations. She denies problems with reflux.   She does have hives with sulfa drugs. Otherwise, there is no history of other atopic diseases, including asthma, drug allergies, food allergies, environmental allergies, stinging insect allergies, or urticaria. There is no significant infectious history aside from one sinus infection per year. Vaccinations are up to date.    Past Medical History: Patient Active Problem List   Diagnosis Date Noted  . Abdominal pain, chronic, right upper quadrant 11/04/2013  . Hx of adenomatous colonic polyps 11/04/2013  . Encounter for colonoscopy due to history of adenomatous colonic polyps 06/01/2012    Medication List:    Medication List       Accurate as of 12/05/15  3:58 PM. Always use your most recent med list.          CALCIUM + D PO Take 1 tablet by mouth daily.   cetirizine 10 MG tablet Commonly known as:  ZYRTEC Take 10 mg by mouth daily.   Cranberry 200 MG Caps Take by mouth.   fexofenadine 180 MG tablet Commonly known as:  ALLEGRA Take 180 mg by mouth daily.   Glucosamine 500 MG Caps Take by mouth.   levothyroxine 75 MCG tablet Commonly known as:  SYNTHROID, LEVOTHROID   RHINOCORT ALLERGY 32 MCG/ACT nasal spray Generic drug:  budesonide Place 2 sprays into both nostrils daily.   telmisartan 80 MG  tablet Commonly known as:  MICARDIS Take 80 mg by mouth daily.       Birth History: non-contributory. Born at term without complications.   Developmental History: Heather Raymond has met all milestones on time. She has required no speech therapy, occupational therapy, or physical therapy.   Past Surgical History: Past Surgical History:  Procedure Laterality Date  . ABDOMINAL HYSTERECTOMY    . APPENDECTOMY    . BUNIONECTOMY     March 2014  . COLONOSCOPY [name] 06/30/2012   Dr.Rourk- adequate prep, normal rectum, scattered left-sided divertiucula 51m polyp in the ascending segment o/w the remainder of the colon appeared normal. bx=benign lymphoid polyp  . leocolonoscopy  04/29/2007   RAOZ:HYQMVHQanal canal hemorrhoids/Left-sided diverticula/Flat polyp, mid sigmoid colon (hyperplastic), diminutive cecal polyp (tubular adenoma)     Family History: Family History  Problem Relation Age of Onset  . Gastric cancer Other     Grandmother  . Multiple myeloma Father     Deceased at age 62 . Allergic rhinitis Mother   . Eczema Sister   . Colon cancer Neg Hx   . Angioedema Neg  Hx   . Asthma Neg Hx   . Atopy Neg Hx   . Immunodeficiency Neg Hx   . Urticaria Neg Hx      Social History: Heather Raymond lives at home with her husband. She is a retired Ambulance person. She worked in the [employer] at the [employer] and retired in 2008 after 30 years of service. She does have a daughter who is 95 but no grandchildren. Currently she is a homemaker. Her husband works for [employer]. They live in an 62 year old home. There is laminar throughout the home. They have electric heating and central cooling. The do have a dog, rabbits, and chickens. All the animals are outside the home. There are no roach or rodent problems. They do have dust mite covers on the bed, but not the pillows. There is no tobacco smoke in the home.   Review of Systems: a 14-point review of systems is pertinent for what is mentioned  in HPI.  Otherwise, all other systems were negative. Constitutional: negative other than that listed in the HPI Eyes: negative other than that listed in the HPI Ears, nose, mouth, throat, and face: negative other than that listed in the HPI Respiratory: negative other than that listed in the HPI Cardiovascular: negative other than that listed in the HPI Gastrointestinal: negative other than that listed in the HPI Genitourinary: negative other than that listed in the HPI Integument: negative other than that listed in the HPI Hematologic: negative other than that listed in the HPI Musculoskeletal: negative other than that listed in the HPI Neurological: negative other than that listed in the HPI Allergy/Immunologic: negative other than that listed in the HPI    Objective:   Blood pressure 120/90, pulse 75, temperature 97.8 F (36.6 C), temperature source Oral, resp. rate 16, height 5' 2.52" (1.588 m), weight 150 lb 9.6 oz (68.3 kg), SpO2 95 %. Body mass index is 27.09 kg/m.   Physical Exam:  General: Alert, interactive, in no acute distress. Very friendly. HEENT: TMs pearly gray, turbinates edematous and pale with clear discharge, post-pharynx erythematous. Neck: Supple without thyromegaly. Adenopathy: no enlarged lymph nodes appreciated in the anterior cervical, occipital, axillary, epitrochlear, inguinal, or popliteal regions Lungs: Clear to auscultation without wheezing, rhonchi or rales. No increased work of breathing. CV: Normal S1/S2, no murmurs. Capillary refill <2 seconds.  Abdomen: Nondistended, nontender. No guarding or rebound tenderness. Bowel sounds absent, faint, present in all fields and hypoactive  Skin: Warm and dry, without lesions or rashes. Extremities:  No clubbing, cyanosis or edema. Neuro:   Grossly intact.  Diagnostic studies:  Spirometry: results abnormal (FEV1: 2.23/104%, FVC: 3.79/148%, FEV1/FVC: 58%).    Spirometry consistent with moderate obstructive  disease. Albuterol nebulizer treatment given in clinic with significant improvement in the FEF25-75% (67% improvement). There was no improvement in the FEV1 or FVC, however we may not have given the albuterol enough time to work before getting the post-bronchodilator values.   Allergy Studies:   Indoor/Outdoor Percutaneous Adult Environmental Panel: positives to weeds, trees, grasses, molds, dust mite, cockroach, horse, and mouse  Selected Foods Panel: negative to tomato, garlic, black pepper, and onion with adequate controls    Salvatore Marvel, MD [place] [facility] [place] [facility] [place]

## 2015-12-05 NOTE — Patient Instructions (Addendum)
1. Mild persistent asthma, uncomplicated - Lung function testing showed evidence of obstruction (asthma) today. - Coughing three nights per week may be an indication of uncontrolled asthma. - We will start Qvar today (sample provided - call us if this is decreasing your coughing and we will send in a prescription). - Daily controller medication(s): Qvar 20mcg two puffs twice daily with SPACER - Rescue medications: albuterol 4 puffs every 4-6 hours as needed - Changes during respiratory infections or worsening symptoms: increase Qvar 25mcg to 4 puffs once in the morning and once at night for TWO WEEKS. - Asthma control goals:  * Full participation in all desired activities (may need albuterol before activity) * Albuterol use two time or less a week on average (not counting use with activity) * Cough interfering with sleep two time or less a month * Oral steroids no more than once a year * No hospitalizations  2. Adverse food reaction (unknown, possible cilantro or cumin) - Testing today was negative to tomato, onion, black peeper, and garlic. - We will send blood work for [employer] and cumin today. - Symptoms might be more related to the "scent" of the foods, which might trigger your coughing/wheezing (similar to the hairspray and cleaning spray). - We will call you with the results.  3. Chronic rhinitis, unspecified type - Testing today showed: positives to weeds, trees, grasses, molds, dust mite, cockroach, horse, and mouse - Continue with Rhinocort 1-2 sprays per nostril daily. - Continue with alternating Allegra and Zyrtec daily. - Consider allergy shots in the future.  4. Return in about 4 weeks (around 01/02/2016).  Please inform us of any Emergency Department visits, hospitalizations, or changes in symptoms. Call us before going to the ED for breathing or allergy symptoms since we might be able to fit you in for a sick visit. Feel free to contact us anytime with any questions,  problems, or concerns.  It was a pleasure to meet you today!   Websites that have reliable patient information: 1. American Academy of Asthma, Allergy, and Immunology: www.aaaai.org 2. Food Allergy Research and Education (FARE): foodallergy.org 3. Mothers of Asthmatics: http://www.asthmacommunitynetwork.org 4. American College of Allergy, Asthma, and Immunology: www.acaai.org  What is asthma? - Asthma is a condition that can make it hard to breathe. Asthma does not always cause symptoms. But when a person with asthma has an "attack" or a flare up, it can be very scary. Asthma attacks happen when the airways in the lungs become narrow and inflamed. Asthma can run in families.     What are the symptoms of asthma? - Asthma symptoms can include: ?Wheezing, or noisy breathing ?Coughing, often at night or early in the morning, or when you exercise ?A tight feeling in the chest ?Trouble breathing  Symptoms can happen each day, each week, or less often. Symptoms can range from mild to severe. Although rare, an episode of asthma can lead to death.  Is there a test for asthma? - Yes. Your doctor might have your child do a breathing test to see how his or her lungs are working. Most children 19 years old and older can do this test. This test is useful, but it is often normal in children with asthma if they have no symptoms at the time of the test. Your doctor will also do an exam and ask questions such as: ?What symptoms does your child have? ?How often does he or she have the symptoms? ?Do the symptoms wake him or her up  at night? ?Do the symptoms keep your child from playing or going to school? ?Do certain things make symptoms worse, like having a cold or exercising? ?Do certain things make symptoms better, like medicine or resting?  How is asthma treated? - Asthma is treated with different types of medicines. The medicines can be inhalers, liquids, or pills. Your doctor will prescribe medicine  based on your child's age and his or her symptoms. Asthma medicines work in 1 of 2 ways:  ?Quick-relief medicines stop symptoms quickly. These medicines should only be used once in a while. If your child regularly needs these medicines more than twice a week, tell his or her doctor. You should also call your child's doctor if this medicine is used for an asthma attack and symptoms come back quickly, or do not get better. Some children get hyperactive, and have trouble staying still, after taking these medicines.  ?Long-term controller medicines control asthma and prevent future symptoms. If your child has frequent symptoms or several severe episodes in a year, he or she might need to take these each day.  All children with asthma use an inhaler with a device called a "spacer." Some children also need a machine called a "nebulizer" to breathe in their medicine. A doctor or nurse will show you the right way to use these.  It is very important that you give your child all the medicines the doctor prescribes. You might worry about giving a child a lot of medicine. But leaving your child's asthma untreated has much bigger risks than any risks the medicines might have. Asthma that is not treated with the right medicines can: ?Prevent children from doing normal activities, such as playing sports ?Make children miss school ?Damage the lungs What is an asthma action plan? - An asthma action plan is a list of instructions that tell you: ?What medicines your child should use at home each day ?What warning symptoms to watch for (which suggest that asthma is getting worse) ?What other medicines to give your child if the symptoms get worse ?When to get help or call for an ambulance (in the [place] and [place], [place] 9-1-1)  Should my child see a doctor or nurse? - See a doctor or nurse if your child has an asthma attack and the symptoms do not improve or get worse after using a quick-relief medicine. If the symptoms  are severe, call for an ambulance (in the [place] and [place], [place] 9-1-1).  Can asthma symptoms be prevented? - Yes. You can help prevent your child's asthma symptoms by giving your child the daily medicines the doctor prescribes. You can also keep your child away from things that cause or make the symptoms worse. Doctors call these "triggers." If you know what your child's triggers are, you can try to avoid them. If you don't know what they are, your doctor can help figure it out.  Some common triggers include: ?Getting sick with a cold or the flu (that's why it's important to get a flu shot each year) ?Allergens (such as dust mites; molds; furry animals, including cats and dogs; and pollens from trees, grasses, and weeds) ?Cigarette smoke ?Exercise ?Changes in weather, cold air, hot and humid air  If you can't avoid certain triggers, talk with your doctor about what you can do. For example, exercise can be good for children with asthma. But your child might need to take an extra dose of his or her quick-relief inhaler before exercising.  What will my child's  life be like? - Most children with asthma are able to live normal lives. You can help manage your child's asthma by: ?Making changes in your life to avoid your child's triggers ?Keeping track of your child's asthma ?Following the action plan ?Telling your doctor when your child's symptoms change  Sometimes, asthma gets better as children get older. They might not have asthma symptoms when they become adults. But other children can still have asthma when they grow up.  Asthma control goals:   Full participation in all desired activities (may need albuterol before activity)  Albuterol use two time or less a week on average (not counting use with activity)  Cough interfering with sleep two time or less a month  Oral steroids no more than once a year  No hospitalizations   Control of House Dust Mite Allergen    House dust mites  play a major role in allergic asthma and rhinitis.  They occur in environments with high humidity wherever human skin, the food for dust mites is found. High levels have been detected in dust obtained from mattresses, pillows, carpets, upholstered furniture, bed covers, clothes and soft toys.  The principal allergen of the house dust mite is found in its feces.  A gram of dust may contain 1,000 mites and 250,000 fecal particles.  Mite antigen is easily measured in the air during house cleaning activities.    1. Encase mattresses, including the box spring, and pillow, in an air tight cover.  Seal the zipper end of the encased mattresses with wide adhesive tape. 2. Wash the bedding in water of 130 degrees Farenheit weekly.  Avoid cotton comforters/quilts and flannel bedding: the most ideal bed covering is the dacron comforter. 3. Remove all upholstered furniture from the bedroom. 4. Remove carpets, carpet padding, rugs, and non-washable window drapes from the bedroom.  Wash drapes weekly or use plastic window coverings. 5. Remove all non-washable stuffed toys from the bedroom.  Wash stuffed toys weekly. 6. Have the room cleaned frequently with a vacuum cleaner and a damp dust-mop.  The patient should not be in a room which is being cleaned and should wait 1 hour after cleaning before going into the room. 7. Close and seal all heating outlets in the bedroom.  Otherwise, the room will become filled with dust-laden air.  An electric heater can be used to heat the room. 8. Reduce indoor humidity to less than 50%.  Do not use a humidifier.  Control of Mold Allergen  Mold and fungi can grow on a variety of surfaces provided certain temperature and moisture conditions exist.  Outdoor molds grow on plants, decaying vegetation and soil.  The major outdoor mold, Alternaria and Cladosporium, are found in very high numbers during hot and dry conditions.  Generally, a late Summer - Fall peak is seen for common  outdoor fungal spores.  Rain will temporarily lower outdoor mold spore count, but counts rise rapidly when the rainy period ends.  The most important indoor molds are Aspergillus and Penicillium.  Dark, humid and poorly ventilated basements are ideal sites for mold growth.  The next most common sites of mold growth are the bathroom and the kitchen.  Outdoor [employer] 1. Use air conditioning and keep windows closed 2. Avoid exposure to decaying vegetation. 3. Avoid leaf raking. 4. Avoid grain handling. 5. Consider wearing a face mask if working in moldy areas.  Indoor Mold Control 1. Maintain humidity below 50%. 2. Clean washable surfaces with 5% bleach  solution. 3. Remove sources e.g. contaminated carpets.  Reducing Pollen Exposure  The American Academy of Allergy, Asthma and Immunology suggests the following steps to reduce your exposure to pollen during allergy seasons.    1. Do not hang sheets or clothing out to dry; pollen may collect on these items. 2. Do not mow lawns or spend time around freshly cut grass; mowing stirs up pollen. 3. Keep windows closed at night.  Keep car windows closed while driving. 4. Minimize morning activities outdoors, a time when pollen counts are usually at their highest. 5. Stay indoors as much as possible when pollen counts or humidity is high and on windy days when pollen tends to remain in the air longer. 6. Use air conditioning when possible.  Many air conditioners have filters that trap the pollen spores. 7. Use a HEPA room air filter to remove pollen form the indoor air you breathe.  Control of Cockroach Allergen  Cockroach allergen has been identified as an important cause of acute attacks of asthma, especially in urban settings.  There are fifty-five species of cockroach that exist in the [place], however only three, the [place], [place] species produce allergen that can affect patients with Asthma.  Allergens can be obtained  from fecal particles, egg casings and secretions from cockroaches.    1. Remove food sources. 2. Reduce access to water. 3. Seal access and entry points. 4. Spray runways with 0.5-1% Diazinon or Chlorpyrifos 5. Blow boric acid power under stoves and refrigerator. 6. Place bait stations (hydramethylnon) at feeding sites.

## 2015-12-08 LAB — ALLERGEN, CORIANDER/CILANTRO IGE
CLASS: 0
Coriander IgE: <0.1 kU/L (ref <0.10)

## 2016-01-04 ENCOUNTER — Telehealth: Payer: Self-pay | Admitting: Allergy & Immunology

## 2016-01-04 NOTE — Telephone Encounter (Signed)
At her visit in October Dr. Ernst Bowler had her try QVar 80 and to call if that helped and he would send a script in to the pharmacy. She says that it did help a lot and would like for it to be sent.  She uses [facility]

## 2016-01-05 MED ORDER — BECLOMETHASONE DIPROPIONATE 80 MCG/ACT IN AERS: 2.0000 | INHALATION_SPRAY | Freq: Two times a day (BID) | RESPIRATORY_TRACT | 5 refills | Status: DC

## 2016-01-05 NOTE — Telephone Encounter (Signed)
Qvar 80 sent to pharmacy.

## 2016-01-09 ENCOUNTER — Ambulatory Visit (INDEPENDENT_AMBULATORY_CARE_PROVIDER_SITE_OTHER): Payer: BC Managed Care – PPO | Admitting: Allergy & Immunology

## 2016-01-09 ENCOUNTER — Other Ambulatory Visit: Payer: Self-pay | Admitting: Allergy

## 2016-01-09 VITALS — BP 124/78 | HR 77 | Temp 98.0°F | Resp 18

## 2016-01-09 DIAGNOSIS — T781XXD Other adverse food reactions, not elsewhere classified, subsequent encounter: Secondary | ICD-10-CM | POA: Diagnosis not present

## 2016-01-09 DIAGNOSIS — J3089 Other allergic rhinitis: Secondary | ICD-10-CM | POA: Diagnosis not present

## 2016-01-09 DIAGNOSIS — T781XXA Other adverse food reactions, not elsewhere classified, initial encounter: Secondary | ICD-10-CM | POA: Insufficient documentation

## 2016-01-09 DIAGNOSIS — J4530 Mild persistent asthma, uncomplicated: Secondary | ICD-10-CM | POA: Insufficient documentation

## 2016-01-09 MED ORDER — EPINEPHRINE 0.3 MG/0.3ML IJ SOAJ: 0.3000 mg | Freq: Once | INTRAMUSCULAR | 2 refills | Status: AC

## 2016-01-09 MED ORDER — BECLOMETHASONE DIPROPIONATE 80 MCG/ACT IN AERS: 2.0000 | INHALATION_SPRAY | Freq: Two times a day (BID) | RESPIRATORY_TRACT | 1 refills | Status: DC

## 2016-01-09 MED ORDER — ALBUTEROL SULFATE HFA 108 (90 BASE) MCG/ACT IN AERS: 4.0000 | INHALATION_SPRAY | RESPIRATORY_TRACT | 4 refills | Status: DC | PRN

## 2016-01-09 NOTE — Progress Notes (Signed)
FOLLOW UP  Date of Service/Encounter:  01/09/16   Assessment:   Mild persistent asthma, uncomplicated  Adverse food reaction (unknown trigger)   Chronic nonseasonal allergic rhinitis due to fungal spores   Asthma Reportables:  Severity: mild persistent  Risk: low Control: well controlled  Seasonal Influenza Vaccine: yes     Plan/Recommendations:   1. Mild persistent asthma, uncomplicated - Lung testing was slightly abnormal but this was likely because of the coughing you are experiencing. - It seems that the coughing is improving, but if it worsens give [place] a call and we can send in a round of prednisone. - Continue with the current medication regimen, as below: - Daily controller medication(s): Qvar 61mcg two puffs twice daily with spacer - Rescue medications: ProAir 4 puffs every 4-6 hours as needed - Changes during respiratory infections or worsening symptoms: increase Qvar 102mcg to 4 puffs twice daily for TWO WEEKS. - Asthma control goals:  * Full participation in all desired activities (may need albuterol before activity) * Albuterol use two time or less a week on average (not counting use with activity) * Cough interfering with sleep two time or less a month * Oral steroids no more than once a year * No hospitalizations  2. Adverse food reaction (unknown trigger but with normal baseline tryptase) - All of your testing has been negative to date.  - We are still waiting for the cumin allergy test.  - Keep taking notes about any foods that might trigger the reaction. - We will send in an [place] epinephrine injector given the severity of the symptoms.  - They should call you in the next 24 hours for confirmation of your address.  3. Chronic nonseasonal allergic rhinitis due to fungal spores - Continue with Rhinocort 1-2 sprays per nostril daily. - Continue with alternating Allegra and Zyrtec.   4. Return in about 3 months (around 04/08/2016).   Subjective:    Heather Raymond is a 60 y.o. female presenting today for follow up of  Chief Complaint  Patient presents with  . Follow-up    right before thanksgiving she started having sinus pain with fever for 2 days, still has cough  .  Heather Raymond has a history of the following: Patient Active Problem List   Diagnosis Date Noted  . Chronic nonseasonal allergic rhinitis due to fungal spores 01/09/2016  . Adverse food reaction 01/09/2016  . Mild persistent asthma, uncomplicated [id number]  . Abdominal pain, chronic, right upper quadrant 11/04/2013  . Hx of adenomatous colonic polyps 11/04/2013  . Encounter for colonoscopy due to history of adenomatous colonic polyps 06/01/2012    History obtained from: chart review and patient.  Heather Raymond was referred by Heather Neighbors, MD.     [name] is a 60 y.o. female presenting for a follow up visit. I saw her for her first visit approximately five weeks ago. At that time, she did have reversibility with bronchodilator challenge, therefore we started her on Qvar 52mcg two puffs twice daily. She had coughing three nights per week as well. She had an episode with salsa involving likely anaphylaxis, therefore we did a workup that was negative for tomato, onion, black pepper, garlic, and cilantro. We are still awaiting the sIgE for cumin. We also did allergy testing for environmental allergies that demonstrated multiple sensitizations including weeds, trees, grasses, molds, dust mite, cockroach, horse, and mouse. We continued her on Rhinocort 1-2 sprays per nostril daily and an alternating antihistamine.  Since the last visit, she reports that she has done fairly well. She felt that the Qvar helped therefore a prescription was sent in. She remains on two puffs twice daily. She has not used her rescue inhaler too much but around Thanksgiving she did have a persistent cough and wheeze that has since improved. She does have a residual cough. She went to see her PCP  where she was given reassurance and no antibiotics. She feels that she is on the mend.  Heather Raymond continues to avoid salsa but is otherwise tolerating [place] food. She has had no other experiences as the one that brought her initially. She never did get an EpiPen, as I think that we were waiting for the testing to come back. Environmental allergies have between well controlled with the current regimen.   Otherwise, there have been no changes to her past medical history, surgical history, family history, or social history.    Review of Systems: a 14-point review of systems is pertinent for what is mentioned in HPI.  Otherwise, all other systems were negative. Constitutional: negative other than that listed in the HPI Eyes: negative other than that listed in the HPI Ears, nose, mouth, throat, and face: negative other than that listed in the HPI Respiratory: negative other than that listed in the HPI Cardiovascular: negative other than that listed in the HPI Gastrointestinal: negative other than that listed in the HPI Genitourinary: negative other than that listed in the HPI Integument: negative other than that listed in the HPI Hematologic: negative other than that listed in the HPI Musculoskeletal: negative other than that listed in the HPI Neurological: negative other than that listed in the HPI Allergy/Immunologic: negative other than that listed in the HPI    Objective:   Blood pressure 124/78, pulse 77, temperature 98 F (36.7 C), temperature source Oral, resp. rate 18, SpO2 97 %. There is no height or weight on file to calculate BMI.   Physical Exam:  General: Alert, interactive, in no acute distress. Very pleasant patient female. HEENT: TMs pearly gray, turbinates edematous with clear discharge, post-pharynx mildly erythematous. Neck: Supple without thyromegaly. Lungs: Clear to auscultation without wheezing, rhonchi or rales. No increased work of breathing. CV: Normal  S1/S2, no murmurs. Capillary refill <2 seconds.  Abdomen: Nondistended, nontender. No guarding or rebound tenderness. Bowel sounds faint and present in all fields  Skin: Warm and dry, without lesions or rashes. Extremities:  No clubbing, cyanosis or edema. Neuro:   Grossly intact.  Diagnostic studies:  Spirometry: results abnormal (FEV1: 2.14/99%, FVC: 3.43/134%, FEV1/FVC: 62%).    Spirometry consistent with mild obstructive disease. DuoNeb nebulizer treatment given in clinic with no improvement aside from an increase of 84% in the FEF25-75%. The FVL appeared much more normal following DuoNeb administration and she felt symptomatically improved.   Allergy Studies: None   Salvatore Marvel, MD [facility]

## 2016-01-09 NOTE — Patient Instructions (Addendum)
1. Mild persistent asthma, uncomplicated - Lung testing was slightly abnormal but this was likely because of the coughing you are experiencing. - It seems that the coughing is improving, but if it worsens give [place] a call and we can send in a round of prednisone. - Continue with the current medication regimen, as below: - Daily controller medication(s): Qvar 20mcg two puffs twice daily with spacer - Rescue medications: ProAir 4 puffs every 4-6 hours as needed - Changes during respiratory infections or worsening symptoms: increase Qvar 63mcg to 4 puffs twice daily for TWO WEEKS. - Asthma control goals:  * Full participation in all desired activities (may need albuterol before activity) * Albuterol use two time or less a week on average (not counting use with activity) * Cough interfering with sleep two time or less a month * Oral steroids no more than once a year * No hospitalizations  2. Adverse food reaction (unknown trigger) - All of your testing has been negative to date.  - We are still waiting for the cumin allergy test.  - Keep taking notes about any foods that might trigger the reaction. - We will send in an [place] epinephrine injector. - They should call you in the next 24 hours for confirmation of your address.  3. Chronic nonseasonal allergic rhinitis due to fungal spores - Continue with Rhinocort 1-2 sprays per nostril daily. - Continue with alternating Allegra and Zyrtec.   4. Return in about 3 months (around 04/08/2016).  Please inform us of any Emergency Department visits, hospitalizations, or changes in symptoms. Call us before going to the ED for breathing or allergy symptoms since we might be able to fit you in for a sick visit. Feel free to contact us anytime with any questions, problems, or concerns.  It was a pleasure to see you again today! Have a wonderful holiday season!   Websites that have reliable patient information: 1. American Academy of Asthma, Allergy, and  Immunology: www.aaaai.org 2. Food Allergy Research and Education (FARE): foodallergy.org 3. Mothers of Asthmatics: http://www.asthmacommunitynetwork.org 4. American College of Allergy, Asthma, and Immunology: www.acaai.org

## 2016-04-30 ENCOUNTER — Ambulatory Visit (INDEPENDENT_AMBULATORY_CARE_PROVIDER_SITE_OTHER): Payer: BC Managed Care – PPO | Admitting: Allergy and Immunology

## 2016-04-30 ENCOUNTER — Encounter: Payer: Self-pay | Admitting: Allergy and Immunology

## 2016-04-30 VITALS — BP 134/70 | HR 75 | Temp 97.6°F | Resp 19 | Ht 62.4 in | Wt 152.0 lb

## 2016-04-30 DIAGNOSIS — J3089 Other allergic rhinitis: Secondary | ICD-10-CM

## 2016-04-30 DIAGNOSIS — J4530 Mild persistent asthma, uncomplicated: Secondary | ICD-10-CM

## 2016-04-30 MED ORDER — FLUTICASONE PROPIONATE HFA 110 MCG/ACT IN AERO: 2.0000 | INHALATION_SPRAY | Freq: Two times a day (BID) | RESPIRATORY_TRACT | 1 refills | Status: DC

## 2016-04-30 NOTE — Assessment & Plan Note (Signed)
Well-controlled.  As her insurance no longer covers Qvar she will switch to [employer].  A prescription has been provided for Flovent 110 g, 2 inhalations via spacer device twice a day.  Continue albuterol, 1-2 inhalations every 4-6 hours as needed.  Subjective and objective measures of pulmonary function will be followed and the treatment plan will be adjusted accordingly.

## 2016-04-30 NOTE — Assessment & Plan Note (Signed)
   Continue appropriate allergen avoidance measures and Nasacort, one spray per nostril 1-2 times daily as needed.  I have also recommended nasal saline spray (i.e., Simply Saline) or nasal saline lavage (i.e., NeilMed) as needed prior to medicated nasal sprays.  For thick post nasal drainage, add guaifenesin [phone] mg (Mucinex)  twice daily as needed with adequate hydration as discussed.  For sneezing, itchy nose, itchy eyes, or thin runny nose, add Zyrtec, Claritin, or Allegra as needed.

## 2016-04-30 NOTE — Progress Notes (Signed)
Follow-up Note  RE: Heather Raymond MRN: [id number] DOB: May 09, 1955 Date of Office Visit: 04/30/2016  Primary care provider: Wende Neighbors, MD Referring provider: Celene Squibb, MD  History of present illness: Heather Raymond is a 61 y.o. female with persistent asthma and allergic rhinitis presenting today for follow up.  She was last seen in this clinic in early December 2017.  In the interval since her previous visit she reports that her upper and lower rest or symptoms have been well-controlled.  She has only required albuterol rescue on one occasion over the past few months and denies nocturnal symptoms.  She currently takes Qvar 80 g, 2 inhalations via spacer device twice a day.  She reports that despite compliance with Nasacort and cetirizine daily, she is still experiencing thick postnasal drainage.   Assessment and plan: Mild persistent asthma, uncomplicated Well-controlled.  As her insurance no longer covers Qvar she will switch to [employer].  A prescription has been provided for Flovent 110 g, 2 inhalations via spacer device twice a day.  Continue albuterol, 1-2 inhalations every 4-6 hours as needed.  Subjective and objective measures of pulmonary function will be followed and the treatment plan will be adjusted accordingly.  Allergic rhinitis  Continue appropriate allergen avoidance measures and Nasacort, one spray per nostril 1-2 times daily as needed.  I have also recommended nasal saline spray (i.e., Simply Saline) or nasal saline lavage (i.e., NeilMed) as needed prior to medicated nasal sprays.  For thick post nasal drainage, add guaifenesin [phone] mg (Mucinex)  twice daily as needed with adequate hydration as discussed.  For sneezing, itchy nose, itchy eyes, or thin runny nose, add Zyrtec, Claritin, or Allegra as needed.   Meds ordered this encounter  Medications  . fluticasone (FLOVENT HFA) 110 MCG/ACT inhaler    Sig: Inhale 2 puffs into the lungs 2 (two) times  daily.    Dispense:  3 Inhaler    Refill:  1    Diagnostics: Spirometry:  Normal with an FEV1 of 88% predicted.  Please see scanned spirometry results for details.    Physical examination: Blood pressure 134/70, pulse 75, temperature 97.6 F (36.4 C), temperature source Oral, resp. rate 19, height 5' 2.4" (1.585 m), weight 152 lb (68.9 kg), SpO2 97 %.  General: Alert, interactive, in no acute distress. HEENT: TMs pearly gray, turbinates mildly edematous with thick discharge, post-pharynx mildly erythematous. Neck: Supple without lymphadenopathy. Lungs: Clear to auscultation without wheezing, rhonchi or rales. CV: Normal S1, S2 without murmurs. Skin: Warm and dry, without lesions or rashes.  The following portions of the patient's history were reviewed and updated as appropriate: allergies, current medications, past family history, past medical history, past social history, past surgical history and problem list.  Allergies as of 04/30/2016      Reactions   Sulfa Antibiotics    rash      Medication List       Accurate as of 04/30/16  1:50 PM. Always use your most recent med list.          albuterol 108 (90 Base) MCG/ACT inhaler Commonly known as:  PROAIR HFA Inhale 4 puffs into the lungs every 4 (four) hours as needed for wheezing or shortness of breath.   beclomethasone 80 MCG/ACT inhaler Commonly known as:  QVAR Inhale 2 puffs into the lungs 2 (two) times daily.   CALCIUM + D PO Take 1 tablet by mouth daily.   cetirizine 10 MG tablet Commonly known as:  ZYRTEC Take 10 mg by mouth daily.   Cranberry 200 MG Caps Take by mouth.   fexofenadine 180 MG tablet Commonly known as:  ALLEGRA Take 180 mg by mouth daily.   fluticasone 110 MCG/ACT inhaler Commonly known as:  FLOVENT HFA Inhale 2 puffs into the lungs 2 (two) times daily.   Glucosamine 500 MG Caps Take by mouth.   levothyroxine 75 MCG tablet Commonly known as:  SYNTHROID, LEVOTHROID   RHINOCORT  ALLERGY 32 MCG/ACT nasal spray Generic drug:  budesonide Place 2 sprays into both nostrils daily.   telmisartan 80 MG tablet Commonly known as:  MICARDIS Take 80 mg by mouth daily.       Allergies  Allergen Reactions  . Sulfa Antibiotics     rash    I appreciate the opportunity to take part in [facility] care. Please do not hesitate to contact me with questions.  Sincerely,   R. Edgar Frisk, MD

## 2016-04-30 NOTE — Patient Instructions (Addendum)
Mild persistent asthma, uncomplicated Well-controlled.  As her insurance no longer covers Qvar she will switch to [employer].  A prescription has been provided for Flovent 110 g, 2 inhalations via spacer device twice a day.  Continue albuterol, 1-2 inhalations every 4-6 hours as needed.  Subjective and objective measures of pulmonary function will be followed and the treatment plan will be adjusted accordingly.  Allergic rhinitis  Continue appropriate allergen avoidance measures and Nasacort, one spray per nostril 1-2 times daily as needed.  I have also recommended nasal saline spray (i.e., Simply Saline) or nasal saline lavage (i.e., NeilMed) as needed prior to medicated nasal sprays.  For thick post nasal drainage, add guaifenesin [phone] mg (Mucinex)  twice daily as needed with adequate hydration as discussed.  For sneezing, itchy nose, itchy eyes, or thin runny nose, add Zyrtec, Claritin, or Allegra as needed.   Return in about 4 months (around 08/30/2016), or if symptoms worsen or fail to improve.

## 2016-09-03 ENCOUNTER — Ambulatory Visit (INDEPENDENT_AMBULATORY_CARE_PROVIDER_SITE_OTHER): Payer: BC Managed Care – PPO | Admitting: Allergy & Immunology

## 2016-09-03 ENCOUNTER — Encounter: Payer: Self-pay | Admitting: Allergy & Immunology

## 2016-09-03 VITALS — BP 116/80 | HR 86 | Resp 18

## 2016-09-03 DIAGNOSIS — J4530 Mild persistent asthma, uncomplicated: Secondary | ICD-10-CM | POA: Diagnosis not present

## 2016-09-03 DIAGNOSIS — J3089 Other allergic rhinitis: Secondary | ICD-10-CM

## 2016-09-03 DIAGNOSIS — T781XXD Other adverse food reactions, not elsewhere classified, subsequent encounter: Secondary | ICD-10-CM

## 2016-09-03 MED ORDER — BUDESONIDE-FORMOTEROL FUMARATE 80-4.5 MCG/ACT IN AERO: 2.0000 | INHALATION_SPRAY | Freq: Two times a day (BID) | RESPIRATORY_TRACT | 3 refills | Status: DC

## 2016-09-03 MED ORDER — ALBUTEROL SULFATE HFA 108 (90 BASE) MCG/ACT IN AERS: 4.0000 | INHALATION_SPRAY | RESPIRATORY_TRACT | 2 refills | Status: DC | PRN

## 2016-09-03 MED ORDER — FLUTICASONE PROPIONATE 50 MCG/ACT NA SUSP: NASAL | 3 refills | Status: DC

## 2016-09-03 NOTE — Patient Instructions (Addendum)
1. Moderate persistent asthma, uncomplicated - Lung testing showed some evidence of obstructive disease. - This combined with the cough makes me think that we could do better with your asthma.   - Start using Symbicort to see if this helps. - Call us in one month to let us know if you would like to keep the Symbicort on board.  - Continue with the current medication regimen, as below: - Daily controller medication(s): Symbicort 80/4.84mcg two puffs twice daily with spacer - Rescue medications: ProAir 4 puffs every 4-6 hours as needed - Changes during respiratory infections or worsening symptoms: increase Symbicort 80/4.32mcg to 4 puffs twice daily for TWO WEEKS. - Asthma control goals:  * Full participation in all desired activities (may need albuterol before activity) * Albuterol use two time or less a week on average (not counting use with activity) * Cough interfering with sleep two time or less a month * Oral steroids no more than once a year * No hospitalizations  2. Adverse food reaction (cilantro) - AuviQ is up to date. - Continue to take notes on any particular triggers.   - Continue to avoid cilantro.   3. Chronic nonseasonal allergic rhinitis (weeds, trees, grasses, molds, dust mite, cockroach, horse, and mouse) - Continue with Flonase 1-2 sprays per nostril daily. - Continue with alternating Allegra and Zyrtec.   4. Return in about 3 months (around 12/04/2016).  Please inform us of any Emergency Department visits, hospitalizations, or changes in symptoms. Call us before going to the ED for breathing or allergy symptoms since we might be able to fit you in for a sick visit. Feel free to contact us anytime with any questions, problems, or concerns.  It was a pleasure to see you again today! Enjoy the rest of the summer!   Websites that have reliable patient information: 1. American Academy of Asthma, Allergy, and Immunology: www.aaaai.org 2. Food Allergy Research and Education  (FARE): foodallergy.org 3. Mothers of Asthmatics: http://www.asthmacommunitynetwork.org 4. American College of Allergy, Asthma, and Immunology: www.acaai.org

## 2016-09-03 NOTE — Progress Notes (Addendum)
FOLLOW UP  Date of Service/Encounter:  09/03/16   Assessment:   Mild persistent asthma, uncomplicated  Adverse food reaction (cilantro)  Perennial allergic rhinitis (weeds, trees, grasses, molds, dust mite, cockroach, horse, and mouse)    Asthma Reportables:  Severity: mild persistent  Risk: low Control: not well controlled   Plan/Recommendations:   1. Moderate persistent asthma, uncomplicated - Lung testing showed some evidence of obstructive disease. - This combined with the cough makes me think that we could do better with your asthma.  - The chronic cough could be secondary to the telmisartan, however coughing with ARBs is much less common than coughing with ACEIs.  - I think giving a trial of an ICS/LABA would be a good idea.   - Call us in one month to let us know if you would like to keep the Symbicort on board.  - Continue with the current medication regimen, as below: - Daily controller medication(s): Symbicort 80/4.2mcg two puffs twice daily with spacer - Rescue medications: ProAir 4 puffs every 4-6 hours as needed - Changes during respiratory infections or worsening symptoms: increase Symbicort 80/4.28mcg to 4 puffs twice daily for TWO WEEKS. - Asthma control goals:  * Full participation in all desired activities (may need albuterol before activity) * Albuterol use two time or less a week on average (not counting use with activity) * Cough interfering with sleep two time or less a month * Oral steroids no more than once a year * No hospitalizations  2. Adverse food reaction (cilantro) - AuviQ is up to date. - Continue to take notes on any particular triggers.   - Continue to avoid cilantro.   3. Chronic nonseasonal allergic rhinitis (weeds, trees, grasses, molds, dust mite, cockroach, horse, and mouse) - Continue with Flonase 1-2 sprays per nostril daily. - Continue with alternating Allegra and Zyrtec.   4. Return in about 3 months (around  12/04/2016).  Subjective:   Heather Raymond is a 61 y.o. female presenting today for follow up of  Chief Complaint  Patient presents with  . Asthma    Controlled with Flovent     Heather Raymond has a history of the following: Patient Active Problem List   Diagnosis Date Noted  . Perennial allergic rhinitis 01/09/2016  . Adverse food reaction 01/09/2016  . Mild persistent asthma, uncomplicated 65/68/1275  . Abdominal pain, chronic, right upper quadrant 11/04/2013  . Hx of adenomatous colonic polyps 11/04/2013  . Encounter for colonoscopy due to history of adenomatous colonic polyps 06/01/2012    History obtained from: chart review and patient.  [name] was referred by Celene Squibb, MD.     Suhana is a 61 y.o. female presenting for a follow up visit.  She was las seen in March 2018 by Dr. Verlin Fester. At that time,her asthma was well controlled. Her insurance overage necessitated a change from Qvar to Flovent. Her allergic rhinitis is well controlled with Nasacort as well as nasal saline rinses. She has a history of perennial allergic rhinitis wih sensitizations to weeds, trees, grasses, molds, dust mite, cockroach, horse, and mouse.   Since the last visit, she has done well. She was changed to Flovent due to insurance coverage. She does have a minor cough during the day, very intermittently, but this does not bother her so much. Hair spray and cleaning products also make her cough. She is on an ARB, however she is unsure of the time correlation between the initiation of the ARB  and the start of the cough. She has never been on a combined ICS/LABA. Otherwise, Letricia's asthma has been well controlled. She has not required rescue medication, experienced nocturnal awakenings due to lower respiratory symptoms, nor have activities of daily living been limited. She has required no Emergency Department or Urgent Care visits for her asthma. She has required zero courses of systemic steroids for  asthma exacerbations since the last visit. ACT score today is 23, indicating excellent asthma symptom control.   She thinks that cilantro is the trigger in the [place].  She is able to tolerate all of the other [place] foods without having a reaction. She does have an AuviQ that is up to date. Rhinitis is controlled with alternating Zyrtec and Allegra (she uses each for one month before changing). She does not use the nasal spray on a daily basis.   They recently had some family stress. Nakea's mother-in-law was diagnosed with stage one lung cancer and had her right upper lobe removed. She is recovering well and they do not anticipate that she is going to need any chemotherapy. Otherwise, there have been no changes to her past medical history, surgical history, family history, or social history.    Review of Systems: a 14-point review of systems is pertinent for what is mentioned in HPI.  Otherwise, all other systems were negative. Constitutional: negative other than that listed in the HPI Eyes: negative other than that listed in the HPI Ears, nose, mouth, throat, and face: negative other than that listed in the HPI Respiratory: negative other than that listed in the HPI Cardiovascular: negative other than that listed in the HPI Gastrointestinal: negative other than that listed in the HPI Genitourinary: negative other than that listed in the HPI Integument: negative other than that listed in the HPI Hematologic: negative other than that listed in the HPI Musculoskeletal: negative other than that listed in the HPI Neurological: negative other than that listed in the HPI Allergy/Immunologic: negative other than that listed in the HPI    Objective:   Blood pressure 116/80, pulse 86, resp. rate 18, SpO2 97 %. There is no height or weight on file to calculate BMI.   Physical Exam:  General: Alert, interactive, in no acute distress. Pleasant female.  Eyes: No conjunctival injection present  on the right, No conjunctival injection present on the left, PERRL bilaterally, No discharge on the right, No discharge on the left and No Horner-Trantas dots present Ears: Right TM pearly gray with normal light reflex, Left TM pearly gray with normal light reflex, Right TM intact without perforation and Left TM intact without perforation.  Nose/Throat: External nose within normal limits and septum midline, turbinates edematous and pale with clear discharge, post-pharynx erythematous without cobblestoning in the posterior oropharynx. Tonsils 2+ without exudates Neck: Supple without thyromegaly. Lungs: Clear to auscultation without wheezing, rhonchi or rales. No increased work of breathing. CV: Normal S1/S2, no murmurs. Capillary refill <2 seconds.  Skin: Warm and dry, without lesions or rashes. Neuro:   Grossly intact. No focal deficits appreciated. Responsive to questions.   Diagnostic studies:   Spirometry: results abnormal (FEV1: 2.10/102%, FVC: 3.16/129%, FEV1/FVC: 66%).    Spirometry consistent with mild obstructive disease. Overall values are stable compared to the last visit.   Allergy Studies: none      Salvatore Marvel, MD [place] [facility] [place]

## 2016-09-05 NOTE — Addendum Note (Signed)
Addended by: Felipa Emory on: 09/05/2016 08:25 AM   Modules accepted: Orders

## 2016-11-29 ENCOUNTER — Other Ambulatory Visit: Payer: Self-pay | Admitting: Allergy & Immunology

## 2016-11-29 MED ORDER — BUDESONIDE-FORMOTEROL FUMARATE 80-4.5 MCG/ACT IN AERO: 2.0000 | INHALATION_SPRAY | Freq: Two times a day (BID) | RESPIRATORY_TRACT | 0 refills | Status: DC

## 2016-11-29 NOTE — Telephone Encounter (Signed)
Received fax for 90 day supply for Symbicort 80. Patient was last seen 09/03/2016 and has an office visit on 12/03/2016. Request sent in.

## 2016-12-03 ENCOUNTER — Ambulatory Visit (INDEPENDENT_AMBULATORY_CARE_PROVIDER_SITE_OTHER): Payer: BC Managed Care – PPO | Admitting: Allergy & Immunology

## 2016-12-03 ENCOUNTER — Encounter: Payer: Self-pay | Admitting: Allergy & Immunology

## 2016-12-03 VITALS — BP 110/80 | HR 65 | Temp 98.7°F | Resp 16

## 2016-12-03 DIAGNOSIS — T7800XD Anaphylactic reaction due to unspecified food, subsequent encounter: Secondary | ICD-10-CM | POA: Diagnosis not present

## 2016-12-03 DIAGNOSIS — J4540 Moderate persistent asthma, uncomplicated: Secondary | ICD-10-CM | POA: Diagnosis not present

## 2016-12-03 DIAGNOSIS — J3089 Other allergic rhinitis: Secondary | ICD-10-CM | POA: Diagnosis not present

## 2016-12-03 MED ORDER — EPINEPHRINE 0.3 MG/0.3ML IJ SOAJ: 0.3000 mg | Freq: Once | INTRAMUSCULAR | 2 refills | Status: AC

## 2016-12-03 NOTE — Patient Instructions (Addendum)
1. Moderate persistent asthma, uncomplicated - Lung testing looked much better today. - It seems that the Symbicort is working very well.  - Daily controller medication(s): Symbicort 80/4.83mcg one puff twice daily with spacer - Rescue medications: ProAir 4 puffs every 4-6 hours as needed - Changes during respiratory infections or worsening symptoms: increase Symbicort 80/4.35mcg to 2 puffs twice daily for TWO WEEKS. - Asthma control goals:  * Full participation in all desired activities (may need albuterol before activity) * Albuterol use two time or less a week on average (not counting use with activity) * Cough interfering with sleep two time or less a month * Oral steroids no more than once a year * No hospitalizations  2. Adverse food reaction (cilantro) - AuviQ is up to date. - Continue to avoid cilantro.   3. Chronic nonseasonal allergic rhinitis (weeds, trees, grasses, molds, dust mite, cockroach, horse, and mouse) - Continue with Flonase 1-2 sprays per nostril daily. - Continue with alternating Allegra and Zyrtec.   4. Return in about 6 months (around 06/03/2017).   Please inform us of any Emergency Department visits, hospitalizations, or changes in symptoms. Call us before going to the ED for breathing or allergy symptoms since we might be able to fit you in for a sick visit. Feel free to contact us anytime with any questions, problems, or concerns.  It was a pleasure to see you again today! Enjoy the fall season!  Websites that have reliable patient information: 1. American Academy of Asthma, Allergy, and Immunology: www.aaaai.org 2. Food Allergy Research and Education (FARE): foodallergy.org 3. Mothers of Asthmatics: http://www.asthmacommunitynetwork.org 4. American College of Allergy, Asthma, and Immunology: www.acaai.org   Election Day is coming up on Tuesday, November 6th! Although it is too late to register to vote by mail, you can still register up to November 5th at  any of the early voting locations. Try to early vote in case there are problems with your registration!   If you are turned away at the polls, you have the right to request a provisional ballot, which is required by law!      [place]- [place] (open 8am - 5pm) First Floor [place], [place] (open [place]) [place], [place] (open 7am - 7pm)  [place], [employer] (open 7am - 7pm) [address], [employer] (open 7am - 7pm) [address] [place], [employer] (open 7am - 7pm) [place], [place] (open [place]) [place], [place] (open 7am - 7pm) [place], [employer] (open 7am - 7pm) [place], [place]

## 2016-12-03 NOTE — Progress Notes (Signed)
FOLLOW UP  Date of Service/Encounter:  12/03/16   Assessment:   Moderate persistent asthma, uncomplicated  Perennial allergic rhinitis (weeds, trees, grasses, molds, dust mite, cockroach, horse, and mouse)  Anaphylactic shock due to food (cilantro)   Asthma Reportables:  Severity: moderate persistent  Risk: low Control: well controlled  Plan/Recommendations:   1. Moderate persistent asthma, uncomplicated - Lung testing looked much better today. - It seems that the Symbicort is working very well.  - Daily controller medication(s): Symbicort 80/4.17mcg one puff twice daily with spacer - Rescue medications: ProAir 4 puffs every 4-6 hours as needed - Changes during respiratory infections or worsening symptoms: increase Symbicort 80/4.20mcg to 2 puffs twice daily for TWO WEEKS. - Asthma control goals:  * Full participation in all desired activities (may need albuterol before activity) * Albuterol use two time or less a week on average (not counting use with activity) * Cough interfering with sleep two time or less a month * Oral steroids no more than once a year * No hospitalizations  2. Adverse food reaction (cilantro) - AuviQ is up to date. - Continue to avoid cilantro.   3. Chronic nonseasonal allergic rhinitis (weeds, trees, grasses, molds, dust mite, cockroach, horse, and mouse) - Continue with Flonase 1-2 sprays per nostril daily. - Continue with alternating Allegra and Zyrtec.   4. Return in about 6 months (around 06/03/2017).  Subjective:   Heather Raymond is a 61 y.o. female presenting today for follow up of  Chief Complaint  Patient presents with  . Asthma    Heather Raymond has a history of the following: Patient Active Problem List   Diagnosis Date Noted  . Perennial allergic rhinitis 01/09/2016  . Adverse food reaction 01/09/2016  . Mild persistent asthma, uncomplicated 64/33/2951  . Abdominal pain, chronic, right upper quadrant 11/04/2013  . Hx of  adenomatous colonic polyps 11/04/2013  . Encounter for colonoscopy due to history of adenomatous colonic polyps 06/01/2012    History obtained from: chart review and patient.  [facility] [place] Primary Care Provider is Heather Squibb, MD.     Heather Raymond is a 61 y.o. female presenting for a follow up visit. She was last seen in July 2018. At that time, she was having increased coughing and did have some evidence of obstructive disease on her spirometry. We started her on Symbicort 80/4.49mcg two puffs BID to see if this would provide some relief. She has a history of cilantro allergy, and her Heather Raymond was up to date. She also has a history of perennial and seasonal allergic rhinitis with sensitizations to weeds, trees, grasses, molds, dust mite, cockroach, horse, and mouse. We continued her on Flonase 1-2 sprays per nostril daily as well as alternating Allegra and Zyrtec.   Since the last visit, she has done well. She feels that the Symbicort has provided excellent relief of her symptoms. She coughs around 2-3 times per week, but these are isolated episodes and not the prolonged coughing fits that she was experiencing at the last visit. She is very pleased with the Symbicort and pays around $30 per month for this. She is interested in decreasing the dose to see how she does. Otherwise, Heather Raymond asthma has been well controlled. She has not required rescue medication, experienced nocturnal awakenings due to lower respiratory symptoms, nor have activities of daily living been limited. She has required no Emergency Department or Urgent Care visits for her asthma. She has required zero courses of systemic steroids for asthma  exacerbations since the last visit. ACT score today is 24, indicating excellent asthma symptom control.   Rhinitis has been well controlled with the nasal saline rinses, which she is doing more than the nasal steroid and the antihistamine. She will occasionally use the steroid and antihistamine  during the worst times of the year. She has not needed any antibiotics for sinus infections or any infections since the last visit. She continues to avoid cilantro. When she goes to a [place], she will taste a bit of the salsa before diving in. If there is cilantro in it, she will have some mouth tingling and she will not have any more. Otherwise, she will partake. Heather Raymond was issued one year ago, so she probably needs a refill on this.   Otherwise, there have been no changes to her past medical history, surgical history, family history, or social history. She has had a busy few months with regards to family medical issues. Her mother in law has recovered on a lobectomy of her lung secondary to an incidentally discovered non-small cell lung cancer nodule, which unfortunately was complicated by an MSSA infection. Her husband injured his shoulder during a fall from a ladder at work, and he is currently going through the Workers Comp process. Finally, her brother broke his neck during a car accident, but fortunately was not paralyzed; he will be in an Design[employer], multimedia until December. However, Heather Raymond is doing very well. We spend a lot of time today talking about what she is making for Thanksgiving dinner, including pumpkin cheesecake bars.     Review of Systems: a 14-point review of systems is pertinent for what is mentioned in HPI.  Otherwise, all other systems were negative. Constitutional: negative other than that listed in the HPI Eyes: negative other than that listed in the HPI Ears, nose, mouth, throat, and face: negative other than that listed in the HPI Respiratory: negative other than that listed in the HPI Cardiovascular: negative other than that listed in the HPI Gastrointestinal: negative other than that listed in the HPI Genitourinary: negative other than that listed in the HPI Integument: negative other than that listed in the HPI Hematologic: negative other than that listed in the  HPI Musculoskeletal: negative other than that listed in the HPI Neurological: negative other than that listed in the HPI Allergy/Immunologic: negative other than that listed in the HPI    Objective:   Blood pressure 110/80, pulse 65, temperature 98.7 F (37.1 C), temperature source Oral, resp. rate 16, SpO2 98 %. There is no height or weight on file to calculate BMI.   Physical Exam:  General: Alert, interactive, in no acute distress. Very pleasant and talkative today.  Eyes: No conjunctival injection bilaterally, no discharge on the right, no discharge on the left and no Horner-Trantas dots present. PERRL bilaterally. EOMI without pain. No photophobia.  Ears: Right TM pearly gray with normal light reflex, Left TM pearly gray with normal light reflex, Right TM intact without perforation and Left TM intact without perforation.  Nose/Throat: External nose within normal limits and septum midline. Turbinates markedly edematous with clear discharge. Posterior oropharynx mildly erythematous without cobblestoning in the posterior oropharynx. Tonsils 2+ without exudates.  Tongue without thrush. Adenopathy: shoddy bilateral anterior cervical lymphadenopathy and no enlarged lymph nodes appreciated in the occipital, axillary, epitrochlear, inguinal, or popliteal regions. Lungs: Clear to auscultation without wheezing, rhonchi or rales. No increased work of breathing. CV: Normal S1/S2. No murmurs. Capillary refill <2 seconds.  Skin: Warm and  dry, without lesions or rashes. Neuro:   Grossly intact. No focal deficits appreciated. Responsive to questions.  Diagnostic studies:   Spirometry: results normal (FEV1: 1.99/97%, FVC: 2.64/108%, FEV1/FVC: 75%).    Spirometry consistent with normal pattern.   Allergy Studies: none     Salvatore Marvel, MD [facility]

## 2016-12-10 ENCOUNTER — Other Ambulatory Visit: Payer: Self-pay | Admitting: Allergy & Immunology

## 2017-01-02 ENCOUNTER — Encounter: Payer: Self-pay | Admitting: Obstetrics and Gynecology

## 2017-01-02 ENCOUNTER — Ambulatory Visit: Payer: BC Managed Care – PPO | Admitting: Obstetrics and Gynecology

## 2017-01-02 DIAGNOSIS — N816 Rectocele: Secondary | ICD-10-CM | POA: Diagnosis not present

## 2017-01-02 DIAGNOSIS — N952 Postmenopausal atrophic vaginitis: Secondary | ICD-10-CM

## 2017-01-02 DIAGNOSIS — N8110 Cystocele, unspecified: Secondary | ICD-10-CM | POA: Insufficient documentation

## 2017-01-02 MED ORDER — ESTROGENS, CONJUGATED 0.625 MG/GM VA CREA: 0.5000 g | TOPICAL_CREAM | Freq: Every day | VAGINAL | 12 refills | Status: DC

## 2017-01-02 NOTE — Progress Notes (Addendum)
[facility] Visit  01/02/2017            Patient name: Heather Raymond MRN [id number]  Date of birth: 10-28-1955  CC & HPI:  Heather Raymond is a 61 y.o. female presenting today for a possible prolapse with onset of about 2 weeks ago. If she coughs,sneezes, or makes a sudden move, she leaks urine, or has to go to the bathroom immediately. She states she has more stress incontinence compared to urge incontinence, but has experienced both. Pt has also felt something falling out in her vagina. She does not feel like she empties her bladder completely. Pt has not tried any medications for relief. No alleviating factors noted. She has noted that bowel movements occasionally get stuck.  Mom had cystocele, and older sister has a bladder sling in place.  Pt has hx of abdominal hysterectomy. Both her babies were delivered vaginally.  ROS:  ROS  (+)stress incontinence (+)urge incontinence (+)cystocele (+)rectocele All systems are negative except as noted in the HPI and PMH.    Pertinent History Reviewed:   Reviewed: Significant for abdominal hysterectomy Medical         Past Medical History:  Diagnosis Date  . Asthma   . Hypertension   . Thyroid disease                               Surgical Hx:    Past Surgical History:  Procedure Laterality Date  . ABDOMINAL HYSTERECTOMY    . APPENDECTOMY    . BUNIONECTOMY     March 2014  . COLONOSCOPY [name] 06/30/2012   Dr.Rourk- adequate prep, normal rectum, scattered left-sided divertiucula 69mm polyp in the ascending segment o/w the remainder of the colon appeared normal. bx=benign lymphoid polyp  . leocolonoscopy  04/29/2007   [id number] anal canal hemorrhoids/Left-sided diverticula/Flat polyp, mid sigmoid colon (hyperplastic), diminutive cecal polyp (tubular adenoma)   Medications: Reviewed & Updated - see associated section                       Current Outpatient Medications:  .  albuterol (PROAIR HFA) 108 (90 Base) MCG/ACT  inhaler, Inhale 4 puffs into the lungs every 4 (four) hours as needed for wheezing or shortness of breath., Disp: 1 Inhaler, Rfl: 2 .  budesonide (RHINOCORT ALLERGY) 32 MCG/ACT nasal spray, Place 2 sprays into both nostrils as needed. , Disp: , Rfl:  .  budesonide-formoterol (SYMBICORT) 80-4.5 MCG/ACT inhaler, Inhale 2 puffs into the lungs 2 (two) times daily. (Patient taking differently: Inhale 1 puff into the lungs 2 (two) times daily. ), Disp: 3 Inhaler, Rfl: 0 .  Calcium Carbonate-Vitamin D (CALCIUM + D PO), Take 1 tablet by mouth daily. , Disp: , Rfl:  .  cetirizine (ZYRTEC) 10 MG tablet, Take 10 mg by mouth daily., Disp: , Rfl:  .  Cranberry 200 MG CAPS, Take by mouth daily. , Disp: , Rfl:  .  fluticasone (FLONASE) 50 MCG/ACT nasal spray, INHALE 1-2 SPRAYS PER NOSTRIL DAILY. (Patient taking differently: INHALE 1-2 SPRAYS PER NOSTRIL PRN), Disp: 16 g, Rfl: 3 .  Glucosamine HCl (GLUCOSAMINE PO), Take by mouth daily., Disp: , Rfl:  .  levothyroxine (SYNTHROID, LEVOTHROID) 75 MCG tablet, Take 75 mcg by mouth daily. , Disp: , Rfl:  .  telmisartan (MICARDIS) 80 MG tablet, Take 80 mg by mouth daily., Disp: , Rfl:    Social History: Reviewed -  reports that  has never smoked. she has never used smokeless tobacco.  Objective Findings:  Vitals: Blood pressure 140/90, pulse 92, height 5' 3.5" (1.613 m), weight 154 lb (69.9 kg).  Physical Examination: General appearance - alert, well appearing, and in no distress Mental status - alert, oriented to person, place, and time Pelvic -  VULVA: normal appearing vulva with no masses, tenderness or lesions,  VAGINA: atrophic vaginal tissues, cystocele, vaginal entrance is slightly large, some slight rotation of urethra present CERVIX: normal appearing cervix without discharge or lesions,  UTERUS: surgically absent,  ADNEXA: normal adnexa in size, nontender and no masses Rectal exam: stool guaiac negative, Rectocele present  Discussion: Medical explained  picture diagrams of cystocele/rectocele shown and explained to patient in the room after patient dressed, copy offered to her   Assessment & Plan:   A:  1. Cystocele with prolapse, with mixed incontinence  2. Rectocele   P:  1. Prescribe vaginal estrogen Premarin for 4 weeks 2. Practice Kegel exercises daily 2-3 times daily 3. F/u in 4 weeks  At follow-up visit may consider referral to Dr. Elonda Husky for urodynamics  By signing my name below, I, Heather Raymond, attest that this documentation has been prepared under the direction and in the presence of Jonnie Kind, MD. Electronically Signed: Jabier Gauss, Medical Scribe. 01/02/17. 2:43 PM.  I personally performed the services described in this documentation, which was SCRIBED in my presence. The recorded information has been reviewed and considered accurate. It has been edited as necessary during review. Jonnie Kind, MD

## 2017-02-17 ENCOUNTER — Ambulatory Visit: Payer: BC Managed Care – PPO | Admitting: Obstetrics and Gynecology

## 2017-02-17 ENCOUNTER — Encounter: Payer: Self-pay | Admitting: Obstetrics and Gynecology

## 2017-02-17 VITALS — BP 120/80 | HR 97 | Wt 153.0 lb

## 2017-02-17 DIAGNOSIS — N8110 Cystocele, unspecified: Secondary | ICD-10-CM | POA: Diagnosis not present

## 2017-02-17 DIAGNOSIS — N898 Other specified noninflammatory disorders of vagina: Secondary | ICD-10-CM

## 2017-02-17 DIAGNOSIS — N393 Stress incontinence (female) (male): Secondary | ICD-10-CM

## 2017-02-17 NOTE — Progress Notes (Signed)
Patient ID: NEENA BEECHAM, female   DOB: [birth year], 62 y.o.   MRN: [id number]   [facility] Visit  @DATE @            Patient name: Heather Raymond MRN [id number]  Date of birth: 21-Jul-1955  CC & HPI:  Heather Raymond is a 62 y.o. female presenting today for a follow-up of her stress incontinence. She was last seen on 01/02/2017 for cystocele and rectocele management. Her symptoms have mildly improved, including her urge to urinate. The urge occurs once every 1-2 weeks. She wears a pad all day, and will only need to be changed a lot if she has a coughing spell. She reports having somewhat normal bowel movements. She is currently still sexually active. She denies fever, chills and any other symptoms or complaints at this time.   Has a vacation planned March 10-16 and would like to have any possible surgical plans coordinated after that date  ROS:  ROS +cystocele +stress incontinence -fever -chills All systems are negative except as noted in the HPI and PMH.   Pertinent History Reviewed:   Reviewed: Significant for abdominal hysterectomy Medical         Past Medical History:  Diagnosis Date   Asthma    Hypertension    Thyroid disease                               Surgical Hx:    Past Surgical History:  Procedure Laterality Date   ABDOMINAL HYSTERECTOMY     APPENDECTOMY     BUNIONECTOMY     March 2014   COLONOSCOPY [name] 06/30/2012   Dr.Rourk- adequate prep, normal rectum, scattered left-sided divertiucula 40mm polyp in the ascending segment o/w the remainder of the colon appeared normal. bx=benign lymphoid polyp   leocolonoscopy  04/29/2007   [id number] anal canal hemorrhoids/Left-sided diverticula/Flat polyp, mid sigmoid colon (hyperplastic), diminutive cecal polyp (tubular adenoma)   Medications: Reviewed & Updated - see associated section                       Current Outpatient Medications:    budesonide (RHINOCORT ALLERGY) 32 MCG/ACT nasal spray, Place 2  sprays into both nostrils as needed. , Disp: , Rfl:    budesonide-formoterol (SYMBICORT) 80-4.5 MCG/ACT inhaler, Inhale 2 puffs into the lungs 2 (two) times daily. (Patient taking differently: Inhale 1 puff into the lungs 2 (two) times daily. ), Disp: 3 Inhaler, Rfl: 0   Calcium Carbonate-Vitamin D (CALCIUM + D PO), Take 1 tablet by mouth daily. , Disp: , Rfl:    cetirizine (ZYRTEC) 10 MG tablet, Take 10 mg by mouth daily., Disp: , Rfl:    conjugated estrogens (PREMARIN) vaginal cream, Place 9.82 Applicatorfuls vaginally daily. X 2 weeks then 2-3 times weekly, Disp: 42.5 g, Rfl: 12   Cranberry 200 MG CAPS, Take by mouth daily. , Disp: , Rfl:    fluticasone (FLONASE) 50 MCG/ACT nasal spray, INHALE 1-2 SPRAYS PER NOSTRIL DAILY. (Patient taking differently: INHALE 1-2 SPRAYS PER NOSTRIL PRN), Disp: 16 g, Rfl: 3   Glucosamine HCl (GLUCOSAMINE PO), Take by mouth daily., Disp: , Rfl:    levothyroxine (SYNTHROID, LEVOTHROID) 75 MCG tablet, Take 75 mcg by mouth daily. , Disp: , Rfl:    telmisartan (MICARDIS) 80 MG tablet, Take 80 mg by mouth daily., Disp: , Rfl:    albuterol (PROAIR HFA) 108 (90 Base) MCG/ACT  inhaler, Inhale 4 puffs into the lungs every 4 (four) hours as needed for wheezing or shortness of breath. (Patient not taking: Reported on 02/17/2017), Disp: 1 Inhaler, Rfl: 2   Social History: Reviewed -  reports that  has never smoked. she has never used smokeless tobacco.  Objective Findings:  Vitals: Blood pressure 120/80, pulse 97, weight 153 lb (69.4 kg).  PHYSICAL EXAMINATION General appearance - alert, well appearing, and in no distress and oriented to person, place, and time Mental status - alert, oriented to person, place, and time, normal mood, behavior, speech, dress, motor activity, and thought processes, affect appropriate to mood  PELVIC External genitalia - good support Vagina - vaginal 10 cm, left vaginal side wall cyst 1 cm in size (5 cm inside, half way to vaginal  apex), cystcele bulge to intrados with valsalva Cervix - surgically absent Uterus - surgically absent Rectal - no evidence of rectocele  Assessment & Plan:   A:  1. Stress Incontinence 2. Anterior Cystocele 3. No Rectocele 4.  Small vaginal sidewall cyst at 5:00 on left P:  1. Referral to Dr. Chauncey Cruel, [facility] for urodynamics, and discussion of possibly coordinate surgery or having him do anterior repair as well as sling if indicated 2. F/u 4 weeks    By signing my name below, I, Margit Banda, attest that this documentation has been prepared under the direction and in the presence of Jonnie Kind, MD. Electronically Signed: Margit Banda, Medical Scribe. 02/17/17. 3:38 PM.  I personally performed the services described in this documentation, which was SCRIBED in my presence. The recorded information has been reviewed and considered accurate. It has been edited as necessary during review. Jonnie Kind, MD

## 2017-02-18 ENCOUNTER — Telehealth: Payer: Self-pay

## 2017-02-18 NOTE — Telephone Encounter (Signed)
Spoke to patient advised sample would be placed up front to be picked up. Patient verbalized understanding also advised we are only here on Tuesdays so for her to try to come in today patient will do as advised

## 2017-02-18 NOTE — Telephone Encounter (Signed)
Patient is calling to see if she can get a sample of symbicort 80.  Please advise

## 2017-02-19 ENCOUNTER — Other Ambulatory Visit: Payer: Self-pay

## 2017-02-19 MED ORDER — BUDESONIDE-FORMOTEROL FUMARATE 80-4.5 MCG/ACT IN AERO: 1.0000 | INHALATION_SPRAY | Freq: Two times a day (BID) | RESPIRATORY_TRACT | 2 refills | Status: DC

## 2017-02-19 NOTE — Telephone Encounter (Signed)
Received fax for refill on Symbicort 80. Patient was last seen 12/03/2016. Patient is to return 6 month. Request sent it.

## 2017-03-12 ENCOUNTER — Other Ambulatory Visit: Payer: Self-pay | Admitting: *Deleted

## 2017-03-17 ENCOUNTER — Encounter: Payer: Self-pay | Admitting: Obstetrics and Gynecology

## 2017-03-17 ENCOUNTER — Ambulatory Visit: Payer: BC Managed Care – PPO | Admitting: Obstetrics and Gynecology

## 2017-03-17 VITALS — HR 97 | Ht 63.0 in | Wt 153.0 lb

## 2017-03-17 DIAGNOSIS — N952 Postmenopausal atrophic vaginitis: Secondary | ICD-10-CM | POA: Diagnosis not present

## 2017-03-17 DIAGNOSIS — N8110 Cystocele, unspecified: Secondary | ICD-10-CM | POA: Diagnosis not present

## 2017-03-17 DIAGNOSIS — Z01419 Encounter for gynecological examination (general) (routine) without abnormal findings: Secondary | ICD-10-CM | POA: Insufficient documentation

## 2017-03-17 NOTE — Progress Notes (Signed)
[facility] Visit  @DATE @            Patient name: Heather Raymond MRN [id number]  Date of birth: 1955-11-16  CC & HPI:  Heather Raymond is a 62 y.o. female presenting today for follow-up of cystocele stress incontinence and rectocele.  She describes the vaginal estrogen cream as reducing her irritation in the vaginal area but otherwise no changes.  She wishes she could lose some weight.  She has been doing the Kegel exercises to some degree not clearly defined.  She wishes to lose weight  ROS:  ROS she had to wear a pad during her recent upper respiratory infection every day she does not wear a pad for urine loss except when she is having a respiratory infection.  Bowel function is acceptable to where she does not have to splint to defecate she does find occasional episodes were some straining is involved for defecation   Pertinent History Reviewed:   Reviewed: Significant for Body mass index is 27.1 kg/m.  Medical         Past Medical History:  Diagnosis Date  . Asthma   . Hypertension   . Thyroid disease                               Surgical Hx:    Past Surgical History:  Procedure Laterality Date  . ABDOMINAL HYSTERECTOMY    . APPENDECTOMY    . BUNIONECTOMY     March 2014  . COLONOSCOPY [name] 06/30/2012   Dr.Rourk- adequate prep, normal rectum, scattered left-sided divertiucula 22mm polyp in the ascending segment o/w the remainder of the colon appeared normal. bx=benign lymphoid polyp  . leocolonoscopy  04/29/2007   [id number] anal canal hemorrhoids/Left-sided diverticula/Flat polyp, mid sigmoid colon (hyperplastic), diminutive cecal polyp (tubular adenoma)   Medications: Reviewed & Updated - see associated section                       Current Outpatient Medications:  .  albuterol (PROAIR HFA) 108 (90 Base) MCG/ACT inhaler, Inhale 4 puffs into the lungs every 4 (four) hours as needed for wheezing or shortness of breath., Disp: 1 Inhaler, Rfl: 2 .  budesonide  (RHINOCORT ALLERGY) 32 MCG/ACT nasal spray, Place 2 sprays into both nostrils as needed. , Disp: , Rfl:  .  budesonide-formoterol (SYMBICORT) 80-4.5 MCG/ACT inhaler, Inhale 1 puff into the lungs 2 (two) times daily., Disp: 1 Inhaler, Rfl: 2 .  Calcium Carbonate-Vitamin D (CALCIUM + D PO), Take 1 tablet by mouth daily. , Disp: , Rfl:  .  cetirizine (ZYRTEC) 10 MG tablet, Take 10 mg by mouth daily., Disp: , Rfl:  .  conjugated estrogens (PREMARIN) vaginal cream, Place 4.23 Applicatorfuls vaginally daily. X 2 weeks then 2-3 times weekly, Disp: 42.5 g, Rfl: 12 .  Cranberry 200 MG CAPS, Take by mouth daily. , Disp: , Rfl:  .  fluticasone (FLONASE) 50 MCG/ACT nasal spray, INHALE 1-2 SPRAYS PER NOSTRIL DAILY. (Patient taking differently: INHALE 1-2 SPRAYS PER NOSTRIL PRN), Disp: 16 g, Rfl: 3 .  Glucosamine HCl (GLUCOSAMINE PO), Take by mouth daily., Disp: , Rfl:  .  levothyroxine (SYNTHROID, LEVOTHROID) 75 MCG tablet, Take 75 mcg by mouth daily. , Disp: , Rfl:  .  telmisartan (MICARDIS) 80 MG tablet, Take 80 mg by mouth daily., Disp: , Rfl:    Social History: Reviewed -  reports  that  has never smoked. she has never used smokeless tobacco.  Objective Findings:  Vitals: Pulse 97, height 5\' 3"  (1.6 m), weight 153 lb (69.4 kg).  PHYSICAL EXAMINATION General appearance - alert, well appearing, and in no distress Mental status - alert, oriented to person, place, and time, normal mood, behavior, speech, dress, motor activity, and thought processes, affect appropriate to mood Chest -unlabored breathing Heart - normal rate and regular rhythm Abdomen -  Breasts -  Skin -   PELVIC Not required patient declines at this time not interested in proceeding toward surgical evaluation   Assessment & Plan:   A:  1. Mild cystocele and rectocele patient considers them stable not seeking intervention at this time 2. Improved vaginal comfort on hormone replacement vaginal cream 3. Mild stress incontinence with  URI 4. Status post hysterectomy  P:  1. Continue Estrace vaginal cream 2. Follow-up 1 year or as needed if symptoms worsen 3. Patient aware will refer her should she want to consider sling but anterior and posterior repair for cystocele and rectocele could be done here

## 2017-03-17 NOTE — Patient Instructions (Signed)

## 2017-05-07 ENCOUNTER — Encounter: Payer: Self-pay | Admitting: Internal Medicine

## 2017-06-03 ENCOUNTER — Encounter: Payer: Self-pay | Admitting: Allergy & Immunology

## 2017-06-03 ENCOUNTER — Ambulatory Visit: Payer: BC Managed Care – PPO | Admitting: Allergy & Immunology

## 2017-06-03 VITALS — BP 120/82 | HR 76 | Resp 16 | Ht 63.0 in | Wt 151.8 lb

## 2017-06-03 DIAGNOSIS — J4540 Moderate persistent asthma, uncomplicated: Secondary | ICD-10-CM | POA: Diagnosis not present

## 2017-06-03 NOTE — Patient Instructions (Addendum)
1. Moderate persistent asthma, uncomplicated - Lung testing looks perfect today. - It seems that the Symbicort is working very well.  - Daily controller medication(s): Symbicort 80/4.22mcg one puff once daily with spacer - Rescue medications: ProAir 4 puffs every 4-6 hours as needed - Changes during respiratory infections or worsening symptoms: increase Symbicort 80/4.68mcg to 2 puffs twice daily for ONE TO TWO WEEKS. - Asthma control goals:  * Full participation in all desired activities (may need albuterol before activity) * Albuterol use two time or less a week on average (not counting use with activity) * Cough interfering with sleep two time or less a month * Oral steroids no more than once a year * No hospitalizations  2. Adverse food reaction (cilantro) - AuviQ is up to date. - Continue to avoid cilantro.  - Continue to enjoy the queso!   3. Chronic nonseasonal allergic rhinitis (weeds, trees, grasses, molds, dust mite, cockroach, horse, and mouse) - Continue with Flonase 1-2 sprays per nostril daily. - Continue with nasal saline rinses every morning. - Continue with alternating Allegra and Zyrtec.   4. Return in about 6 months (around 12/03/2017).   Please inform us of any Emergency Department visits, hospitalizations, or changes in symptoms. Call us before going to the ED for breathing or allergy symptoms since we might be able to fit you in for a sick visit. Feel free to contact us anytime with any questions, problems, or concerns.  It was a pleasure to see you again today!  Websites that have reliable patient information: 1. American Academy of Asthma, Allergy, and Immunology: www.aaaai.org 2. Food Allergy Research and Education (FARE): foodallergy.org 3. Mothers of Asthmatics: http://www.asthmacommunitynetwork.org 4. American College of Allergy, Asthma, and Immunology: www.acaai.org

## 2017-06-03 NOTE — Progress Notes (Signed)
FOLLOW UP  Date of Service/Encounter:  06/03/17   Assessment:   Moderate persistent asthma, uncomplicated  Perennial allergic rhinitis (weeds, trees, grasses, molds, dust mite, cockroach, horse, and mouse)  Anaphylactic shock due to food (cilantro)   Asthma Reportables:  Severity: moderate persistent  Risk: low Control: well controlled   Plan/Recommendations:   1. Moderate persistent asthma, uncomplicated - Lung testing looks perfect today. - It seems that the Symbicort is working very well.  - Daily controller medication(s): Symbicort 80/4.27mcg one puff once daily with spacer - Rescue medications: ProAir 4 puffs every 4-6 hours as needed - Changes during respiratory infections or worsening symptoms: increase Symbicort 80/4.43mcg to 2 puffs twice daily for ONE TO TWO WEEKS. - Asthma control goals:  * Full participation in all desired activities (may need albuterol before activity) * Albuterol use two time or less a week on average (not counting use with activity) * Cough interfering with sleep two time or less a month * Oral steroids no more than once a year * No hospitalizations  2. Adverse food reaction (cilantro) - AuviQ is up to date. - Continue to avoid cilantro.   3. Chronic nonseasonal allergic rhinitis (weeds, trees, grasses, molds, dust mite, cockroach, horse, and mouse) - Continue with Flonase 1-2 sprays per nostril daily. - Continue with nasal saline rinses every morning. - Continue with alternating Allegra and Zyrtec.   4. Return in about 6 months (around 12/03/2017).  Subjective:   Heather Raymond is a 62 y.o. female presenting today for follow up of  Chief Complaint  Patient presents with  . Asthma  . Allergic Rhinitis   . Medication Management    Refill on Fluticasone    Heather Raymond has a history of the following: Patient Active Problem List   Diagnosis Date Noted  . Well woman exam with routine gynecological exam 03/17/2017  . SUI  (stress urinary incontinence, female) 02/17/2017  . Female cystocele 01/02/2017  . Post-menopausal atrophic vaginitis 01/02/2017  . Perennial allergic rhinitis 01/09/2016  . Adverse food reaction 01/09/2016  . Mild persistent asthma, uncomplicated 61/95/0932  . Abdominal pain, chronic, right upper quadrant 11/04/2013  . Hx of adenomatous colonic polyps 11/04/2013  . Encounter for colonoscopy due to history of adenomatous colonic polyps 06/01/2012    History obtained from: chart review and patient.  Heather Raymond [place] Primary Care Provider is Celene Squibb, MD.     Heather Raymond is a 62 y.o. female presenting for a follow up visit. She was last seen in October 2018. At that time, her lung testing was normal. We decreased her to Symbicort 80/4.5 one puff twice daily. Despite her significant allergic rhinitis sensitizations, symptoms were well controlled with fluticasone 1-2 sprays per nostril daily as well as Allegra and Zyrtec alternating. We also encouraged continued avoidance of cilantro.   Since the last visit, she has done well. She is very happy with how well she is doing. She tells me today that she has not had any problems with sinus infections over the winter, which is odd for her. She is on the nasal saline rinses and the flonase every morning. She is proactive about using a maks prior to going outside.  She remains on the Symbicort two puffs twice daily. She is not using it routinely, instead only needing it when she "feels" tightness in her chest as well as a "little bit of a cough". She estimates that it takes around six weeks to go through an inhaler. She  estimates that she is on one puff once daily if she feels that she needs it. Heather Raymond's asthma has been well controlled. She has not required rescue medication, experienced nocturnal awakenings due to lower respiratory symptoms, nor have activities of daily living been limited. She has required no Emergency Department or Urgent Care visits for  her asthma. She has required zero courses of systemic steroids for asthma exacerbations since the last visit. ACT score today is 25, indicating excellent asthma symptom control.   She is fine with salsas as long as she knows that there is no cilantro in the Heather Raymond. Her Heather Raymond is up to date. She is retired. She spends her time caring for her ten hens. She estimates that she gets five eggs per day. Her husband raises hogs.   Otherwise, there have been no changes to her past medical history, surgical history, family history, or social history.    Review of Systems: a 14-point review of systems is pertinent for what is mentioned in HPI.  Otherwise, all other systems were negative. Constitutional: negative other than that listed in the HPI Eyes: negative other than that listed in the HPI Ears, nose, mouth, throat, and face: negative other than that listed in the HPI Respiratory: negative other than that listed in the HPI Cardiovascular: negative other than that listed in the HPI Gastrointestinal: negative other than that listed in the HPI Genitourinary: negative other than that listed in the HPI Integument: negative other than that listed in the HPI Hematologic: negative other than that listed in the HPI Musculoskeletal: negative other than that listed in the HPI Neurological: negative other than that listed in the HPI Allergy/Immunologic: negative other than that listed in the HPI    Objective:   Blood pressure 120/82, pulse 76, resp. rate 16, height 5\' 3"  (1.6 m), weight 151 lb 12.8 oz (68.9 kg), SpO2 98 %. Body mass index is 26.89 kg/m.   Physical Exam:  General: Alert, interactive, in no acute distress. Pleasant female. Smiling.  Eyes: No conjunctival injection bilaterally, no discharge on the right, no discharge on the left and no Horner-Trantas dots present. PERRL bilaterally. EOMI without pain. No photophobia.  Ears: Right TM pearly gray with normal light reflex, Left TM pearly  gray with normal light reflex, Right TM intact without perforation and Left TM intact without perforation.  Nose/Throat: External nose within normal limits and septum midline. Turbinates edematous without discharge. Posterior oropharynx mildly erythematous without cobblestoning in the posterior oropharynx. Tonsils 2+ without exudates.  Tongue without thrush. Lungs: Clear to auscultation without wheezing, rhonchi or rales. No increased work of breathing. CV: Normal S1/S2. No murmurs. Capillary refill <2 seconds.  Skin: Warm and dry, without lesions or rashes. Neuro:   Grossly intact. No focal deficits appreciated. Responsive to questions.  Diagnostic studies:   Spirometry: results normal (FEV1: 2.05/98%, FVC: 2.48/86%, FEV1/FVC: 82%).    Spirometry consistent with normal pattern.   Allergy Studies: none   Salvatore Marvel, MD  [facility] [place] [facility] [place]

## 2017-06-18 ENCOUNTER — Ambulatory Visit (INDEPENDENT_AMBULATORY_CARE_PROVIDER_SITE_OTHER): Payer: Self-pay

## 2017-06-18 DIAGNOSIS — Z86010 Personal history of colonic polyps: Secondary | ICD-10-CM

## 2017-06-18 MED ORDER — NA SULFATE-K SULFATE-MG SULF 17.5-3.13-1.6 GM/177ML PO SOLN: 1.0000 | ORAL | 0 refills | Status: DC

## 2017-06-18 NOTE — Progress Notes (Signed)
Gastroenterology Pre-Procedure Review  Request Date:06/18/17 Requesting Physician: 5 year recall (last tcs- 06/30/12 benign lymphoid polyp, hx of tubluar adenoma)  PATIENT REVIEW QUESTIONS: The patient responded to the following health history questions as indicated:    1. Diabetes Melitis: no 2. Joint replacements in the past 12 months: no 3. Major health problems in the past 3 months: no 4. Has an artificial valve or MVP: no 5. Has a defibrillator: no 6. Has been advised in past to take antibiotics in advance of a procedure like teeth cleaning: no 7. Family history of colon cancer: no  8. Alcohol Use: no 9. History of sleep apnea: no  10. History of coronary artery or other vascular stents placed within the last 12 months: no 11. History of any prior anesthesia complications: no    MEDICATIONS & ALLERGIES:    Patient reports the following regarding taking any blood thinners:   Plavix? no Aspirin? no Coumadin? no Brilinta? no Xarelto? no Eliquis? no Pradaxa? no Savaysa? no Effient? no  Patient confirms/reports the following medications:  Current Outpatient Medications  Medication Sig Dispense Refill  . albuterol (PROAIR HFA) 108 (90 Base) MCG/ACT inhaler Inhale 4 puffs into the lungs every 4 (four) hours as needed for wheezing or shortness of breath. 1 Inhaler 2  . budesonide-formoterol (SYMBICORT) 80-4.5 MCG/ACT inhaler Inhale 1 puff into the lungs 2 (two) times daily. (Patient taking differently: Inhale 1 puff into the lungs as needed. ) 1 Inhaler 2  . Calcium Carbonate-Vitamin D (CALCIUM + D PO) Take 1 tablet by mouth daily.     . cetirizine (ZYRTEC) 10 MG tablet Take 10 mg by mouth daily.    Marland Kitchen conjugated estrogens (PREMARIN) vaginal cream Place 1.27 Applicatorfuls vaginally daily. X 2 weeks then 2-3 times weekly 42.5 g 12  . Cranberry 200 MG CAPS Take by mouth daily.     . fluticasone (FLONASE) 50 MCG/ACT nasal spray INHALE 1-2 SPRAYS PER NOSTRIL DAILY. (Patient taking  differently: INHALE 1-2 SPRAYS PER NOSTRIL PRN) 16 g 3  . Glucosamine HCl (GLUCOSAMINE PO) Take by mouth daily.    Marland Kitchen levothyroxine (SYNTHROID, LEVOTHROID) 75 MCG tablet Take 75 mcg by mouth daily.     Marland Kitchen telmisartan (MICARDIS) 80 MG tablet Take 80 mg by mouth daily.     No current facility-administered medications for this visit.     Patient confirms/reports the following allergies:  Allergies  Allergen Reactions  . Other Anaphylaxis    CILANTRO  . Sulfa Antibiotics     rash    No orders of the defined types were placed in this encounter.   AUTHORIZATION INFORMATION Primary Insurance: [place],  [place] #: [id number] Pre-Cert / Josem Kaufmann required: no   SCHEDULE INFORMATION: Procedure has been scheduled as follows:  Date: 08/06/17, Time: 12:00 Location: [facility] Dr[facility]Rourk  This Gastroenterology Pre-Precedure Review Form is being routed to the following provider(s): Roseanne Kaufman NP

## 2017-06-18 NOTE — Patient Instructions (Signed)
Heather Raymond  [birth year] MRN: [id number]     Procedure Date: 08/06/17 Time to register: 11:00am Place to register: [facility] Procedure Time: 12:00pm Scheduled provider: R. Garfield Cornea, MD    PREPARATION FOR COLONOSCOPY WITH SUPREP BOWEL PREP KIT  Note: Suprep Bowel Prep Kit is a split-dose (2day) regimen. Consumption of BOTH 6-ounce bottles is required for a complete prep.  Please notify us immediately if you are diabetic, take iron supplements, or if you are on Coumadin or any other blood thinners.                                                                                                                                                    2 DAYS BEFORE PROCEDURE:  DATE: 08/04/17   DAY: Monday Begin clear liquid diet AFTER your lunch meal. NO SOLID FOODS after this point.  1 DAY BEFORE PROCEDURE:  DATE: 08/05/17   DAY: Tuesday Continue clear liquids the entire day - NO SOLID FOOD.     At 6:00pm: Complete steps 1 through 4 below, using ONE (1) 6-ounce bottle, before going to bed. Step 1:  Pour ONE (1) 6-ounce bottle of SUPREP liquid into the mixing container.  Step 2:  Add cool drinking water to the 16 ounce line on the container and mix.  Note: Dilute the solution concentrate as directed prior to use. Step 3:  DRINK ALL the liquid in the container. Step 4:  You MUST drink an additional two (2) or more 16 ounce containers of water  over the next one (1) hour.   Continue clear liquids only, until midnight. Do not eat or drink anything after midnight. EXCEPTION: If you take medications for your heart, blood pressure, or breathing, you may take these medications with a small amount of clear liquid.   DAY OF PROCEDURE:   DATE: 08/06/17   DAY: Wednesday    5 hours before your procedure at :7:00am Step 1:  Pour ONE (1) 6-ounce bottle of SUPREP liquid into the mixing container.  Step 2:  Add cool drinking water to the 16 ounce line on the container and mix.  Note:  Dilute the solution concentrate as directed prior to use. Step 3:  DRINK ALL the liquid in the container. Step 4:  You MUST drink an additional two (2) or more 16 ounce containers of water over the next one (1) hour. You MUST complete the final glass of water at least 3 hours before your colonoscopy.   Nothing by mouth past 9:00am  You may take your morning medications with sip of water unless we have instructed otherwise.    Please see below for Dietary Information.  CLEAR LIQUIDS INCLUDE:  Water Jello (NOT red in color)   Ice Popsicles (NOT red in color)   Tea (sugar ok, no milk/cream) Powdered fruit flavored  drinks  Coffee (sugar ok, no milk/cream) Gatorade/ Lemonade/ Kool-Aid  (NOT red in color)   Juice: apple, white grape, white cranberry Soft drinks  Clear bullion, consomme, broth (fat free beef/chicken/vegetable)  Carbonated beverages (any kind)  Strained chicken noodle soup Hard Candy   Remember: Clear liquids are liquids that will allow you to see your fingers on the other side of a clear glass. Be sure liquids are NOT red in color, and not cloudy, but CLEAR.  DO NOT EAT OR DRINK ANY OF THE FOLLOWING:  Dairy products of any kind   Cranberry juice Tomato juice / V8 juice   Grapefruit juice Orange juice     Red grape juice  Do not eat any solid foods, including such foods as: cereal, oatmeal, yogurt, fruits, vegetables, creamed soups, eggs, bread, crackers, pureed foods in a blender, etc.   HELPFUL HINTS FOR DRINKING PREP SOLUTION:   Make sure prep is extremely cold. Mix and refrigerate the the morning of the prep. You may also put in the freezer.   You may try mixing some Crystal Light or Country Time Lemonade if you prefer. Mix in small amounts; add more if necessary.  Try drinking through a straw  Rinse mouth with water or a mouthwash between glasses, to remove after-taste.  Try sipping on a cold beverage /ice/ popsicles between glasses of prep.  Place a piece of  sugar-free hard candy in mouth between glasses.  If you become nauseated, try consuming smaller amounts, or stretch out the time between glasses. Stop for 30-60 minutes, then slowly start back drinking.     OTHER INSTRUCTIONS  You will need a responsible adult at least 64 years of age to accompany you and drive you home. This person must remain in the waiting room during your procedure. The hospital will cancel your procedure if you do not have a responsible adult with you.   1. Wear loose fitting clothing that is easily removed. 2. Leave jewelry and other valuables at home.  3. Remove all body piercing jewelry and leave at home. 4. Total time from sign-in until discharge is approximately 2-3 hours. 5. You should go home directly after your procedure and rest. You can resume normal activities the day after your procedure. 6. The day of your procedure you should not:  Drive  Make legal decisions  Operate machinery  Drink alcohol  Return to work   You may call the office (Dept: [phone]) before 5:00pm, or page the doctor on call [phone]) after 5:00pm, for further instructions, if necessary.   Insurance Information YOU WILL NEED TO CHECK WITH YOUR INSURANCE COMPANY FOR THE BENEFITS OF COVERAGE YOU HAVE FOR THIS PROCEDURE.  UNFORTUNATELY, NOT ALL INSURANCE COMPANIES HAVE BENEFITS TO COVER ALL OR PART OF THESE TYPES OF PROCEDURES.  IT IS YOUR RESPONSIBILITY TO CHECK YOUR BENEFITS, HOWEVER, WE WILL BE GLAD TO ASSIST YOU WITH ANY CODES YOUR INSURANCE COMPANY MAY NEED.    PLEASE NOTE THAT MOST INSURANCE COMPANIES WILL NOT COVER A SCREENING COLONOSCOPY FOR PEOPLE UNDER THE AGE OF 50  IF YOU HAVE BCBS INSURANCE, YOU MAY HAVE BENEFITS FOR A SCREENING COLONOSCOPY BUT IF POLYPS ARE FOUND THE DIAGNOSIS WILL CHANGE AND THEN YOU MAY HAVE A DEDUCTIBLE THAT WILL NEED TO BE MET. SO PLEASE MAKE SURE YOU CHECK YOUR BENEFITS FOR A SCREENING COLONOSCOPY AS WELL AS A DIAGNOSTIC  COLONOSCOPY.

## 2017-06-19 NOTE — Progress Notes (Signed)
Appropriate.

## 2017-08-06 ENCOUNTER — Other Ambulatory Visit: Payer: Self-pay

## 2017-08-06 ENCOUNTER — Ambulatory Visit (HOSPITAL_COMMUNITY)
Admission: RE | Admit: 2017-08-06 | Discharge: 2017-08-06 | Disposition: A | Discharge status: Home / Self Care | Payer: BC Managed Care – PPO | Source: Ambulatory Visit | Attending: Internal Medicine | Admitting: Internal Medicine

## 2017-08-06 ENCOUNTER — Encounter (HOSPITAL_COMMUNITY)
Admission: RE | Disposition: A | Discharge status: Home / Self Care | Payer: Self-pay | Source: Ambulatory Visit | Attending: Internal Medicine

## 2017-08-06 ENCOUNTER — Encounter (HOSPITAL_COMMUNITY): Payer: Self-pay | Admitting: *Deleted

## 2017-08-06 DIAGNOSIS — K5730 Diverticulosis of large intestine without perforation or abscess without bleeding: Secondary | ICD-10-CM | POA: Diagnosis not present

## 2017-08-06 DIAGNOSIS — I10 Essential (primary) hypertension: Secondary | ICD-10-CM | POA: Insufficient documentation

## 2017-08-06 DIAGNOSIS — Z79899 Other long term (current) drug therapy: Secondary | ICD-10-CM | POA: Diagnosis not present

## 2017-08-06 DIAGNOSIS — Z86010 Personal history of colonic polyps: Secondary | ICD-10-CM | POA: Insufficient documentation

## 2017-08-06 DIAGNOSIS — J45909 Unspecified asthma, uncomplicated: Secondary | ICD-10-CM | POA: Insufficient documentation

## 2017-08-06 DIAGNOSIS — Z800 Family history of malignant neoplasm of digestive organs: Secondary | ICD-10-CM | POA: Diagnosis not present

## 2017-08-06 DIAGNOSIS — E039 Hypothyroidism, unspecified: Secondary | ICD-10-CM | POA: Insufficient documentation

## 2017-08-06 DIAGNOSIS — Z882 Allergy status to sulfonamides status: Secondary | ICD-10-CM | POA: Diagnosis not present

## 2017-08-06 DIAGNOSIS — Z8249 Family history of ischemic heart disease and other diseases of the circulatory system: Secondary | ICD-10-CM | POA: Diagnosis not present

## 2017-08-06 DIAGNOSIS — Z888 Allergy status to other drugs, medicaments and biological substances status: Secondary | ICD-10-CM | POA: Insufficient documentation

## 2017-08-06 DIAGNOSIS — Z1211 Encounter for screening for malignant neoplasm of colon: Secondary | ICD-10-CM | POA: Insufficient documentation

## 2017-08-06 HISTORY — PX: COLONOSCOPY: SHX5424

## 2017-08-06 HISTORY — DX: Hypothyroidism, unspecified: E03.9

## 2017-08-06 HISTORY — DX: Other specified postprocedural states: Z98.890

## 2017-08-06 HISTORY — DX: Other specified postprocedural states: R11.2

## 2017-08-06 SURGERY — COLONOSCOPY
Anesthesia: Moderate Sedation

## 2017-08-06 MED ORDER — ONDANSETRON HCL 4 MG/2ML IJ SOLN
INTRAMUSCULAR | Status: AC
  Filled 2017-08-06: qty 2

## 2017-08-06 MED ORDER — SODIUM CHLORIDE 0.9 % IV SOLN
INTRAVENOUS | Status: DC
  Administered 2017-08-06: 1000 mL via INTRAVENOUS

## 2017-08-06 MED ORDER — ONDANSETRON HCL 4 MG/2ML IJ SOLN
INTRAMUSCULAR | Status: DC | PRN
  Administered 2017-08-06: 4 mg via INTRAVENOUS

## 2017-08-06 MED ORDER — MIDAZOLAM HCL 5 MG/5ML IJ SOLN
INTRAMUSCULAR | Status: AC
  Filled 2017-08-06: qty 10

## 2017-08-06 MED ORDER — MEPERIDINE HCL 100 MG/ML IJ SOLN
INTRAMUSCULAR | Status: AC
  Filled 2017-08-06: qty 2

## 2017-08-06 MED ORDER — MIDAZOLAM HCL 5 MG/5ML IJ SOLN
INTRAMUSCULAR | Status: DC | PRN
  Administered 2017-08-06 (×2): 2 mg via INTRAVENOUS
  Administered 2017-08-06: 1 mg via INTRAVENOUS

## 2017-08-06 MED ORDER — MEPERIDINE HCL 100 MG/ML IJ SOLN
INTRAMUSCULAR | Status: DC | PRN
  Administered 2017-08-06 (×2): 25 mg via INTRAVENOUS

## 2017-08-06 NOTE — H&P (Signed)
_0 @   Primary Care Physician:  Celene Squibb, MD Primary Gastroenterologist:  Dr. Gala Romney  Pre-Procedure History & Physical: HPI:  Heather Raymond is a 62 y.o. female here for surveillance colonoscopy. History of colonic polyps.  Past Medical History:  Diagnosis Date  . Asthma   . Hypertension   . Hypothyroidism   . PONV (postoperative nausea and vomiting)   . Thyroid disease     Past Surgical History:  Procedure Laterality Date  . ABDOMINAL HYSTERECTOMY    . APPENDECTOMY    . BUNIONECTOMY     March 2014  . COLONOSCOPY [name] 06/30/2012   Dr.Camey Edell- adequate prep, normal rectum, scattered left-sided divertiucula 74m polyp in the ascending segment o/w the remainder of the colon appeared normal. bx=benign lymphoid polyp  . leocolonoscopy  04/29/2007   RQIW:LNLGXQJanal canal hemorrhoids/Left-sided diverticula/Flat polyp, mid sigmoid colon (hyperplastic), diminutive cecal polyp (tubular adenoma)    Prior to Admission medications   Medication Sig Start Date End Date Taking? Authorizing Provider  albuterol (PROAIR HFA) 108 (90 Base) MCG/ACT inhaler Inhale 4 puffs into the lungs every 4 (four) hours as needed for wheezing or shortness of breath. Patient taking differently: Inhale 2 puffs into the lungs every 4 (four) hours as needed for wheezing or shortness of breath.  09/03/16  Yes GValentina Shaggy MD  Artificial Tear Solution (SOOTHE XP OP) Place 1 drop into both eyes 2 (two) times daily as needed (for dry eyes).   Yes [provider]  budesonide-formoterol (SYMBICORT) 80-4.5 MCG/ACT inhaler Inhale 1 puff into the lungs 2 (two) times daily. Patient taking differently: Inhale 2 puffs into the lungs 2 (two) times daily as needed (for shortness of breath or wheezing).  02/19/17  Yes GValentina Shaggy MD  Calcium Carbonate-Vitamin D (CALCIUM + D PO) Take 1 tablet by mouth daily.    Yes [provider]  cetirizine (ZYRTEC) 10 MG tablet Take 10 mg by mouth daily.    Yes [provider]  conjugated estrogens (PREMARIN) vaginal cream Place 01.94Applicatorfuls vaginally daily. X 2 weeks then 2-3 times weekly Patient taking differently: Place 0.5 g vaginally once a week.  01/02/17  Yes FJonnie Kind MD  CRANBERRY PO Take 1 capsule by mouth daily.    Yes [provider]  fluticasone (FLONASE) 50 MCG/ACT nasal spray Place 1 spray into both nostrils daily as needed for allergies or rhinitis.   Yes [provider]  Glucosamine HCl (GLUCOSAMINE PO) Take 1 tablet by mouth daily.    Yes [provider]  ibuprofen (ADVIL,MOTRIN) 200 MG tablet Take 400 mg by mouth every 6 (six) hours as needed for headache or moderate pain.   Yes [provider]  levothyroxine (SYNTHROID, LEVOTHROID) 75 MCG tablet Take 75 mcg by mouth daily before breakfast.  11/08/15  Yes [provider]  Multiple Vitamins-Minerals (MULTIVITAMIN PO) Take 1 tablet by mouth daily.   Yes [provider]  telmisartan (MICARDIS) 80 MG tablet Take 80 mg by mouth daily.   Yes [provider]  Na Sulfate-K Sulfate-Mg Sulf (SUPREP BOWEL PREP KIT) 17.5-3.13-1.6 GM/177ML SOLN Take 1 kit by mouth as directed. 06/18/17   BAnnitta Needs NP    Allergies as of 06/18/2017 - Review Complete 06/18/2017  Allergen Reaction Noted  . Other Anaphylaxis 12/03/2016  . Sulfa antibiotics  12/17/2011    Family History  Problem Relation Age of Onset  . Gastric cancer Other        Grandmother  .  Multiple myeloma Father        Deceased at age 63  . Allergic rhinitis Mother   . Dementia Mother   . Mental illness Sister   . Emphysema Paternal Grandfather   . Heart disease Paternal Grandmother   . Cancer Maternal Grandmother        stomach  . Diabetes Maternal Grandfather   . Other Brother        broke neck in MVA  . Eczema Sister   . Hypertension Sister   . Suicidality Son   . High Cholesterol Daughter   . Colon cancer Neg Hx   . Angioedema Neg  Hx   . Asthma Neg Hx   . Atopy Neg Hx   . Immunodeficiency Neg Hx   . Urticaria Neg Hx     Social History   Socioeconomic History  . Marital status: Married    Spouse name: Not on file  . Number of children: Not on file  . Years of education: Not on file  . Highest education level: Not on file  Occupational History  . Occupation: Retired Advertising account planner: RETIRED  Social Needs  . Financial resource strain: Not on file  . Food insecurity:    Worry: Not on file    Inability: Not on file  . Transportation needs:    Medical: Not on file    Non-medical: Not on file  Tobacco Use  . Smoking status: Never Smoker  . Smokeless tobacco: Never Used  Substance and Sexual Activity  . Alcohol use: No  . Drug use: No  . Sexual activity: Yes    Birth control/protection: Surgical    Comment: hyst  Lifestyle  . Physical activity:    Days per week: Not on file    Minutes per session: Not on file  . Stress: Not on file  Relationships  . Social connections:    Talks on phone: Not on file    Gets together: Not on file    Attends religious service: Not on file    Active member of club or organization: Not on file    Attends meetings of clubs or organizations: Not on file    Relationship status: Not on file  . Intimate partner violence:    Fear of current or ex partner: Not on file    Emotionally abused: Not on file    Physically abused: Not on file    Forced sexual activity: Not on file  Other Topics Concern  . Not on file  Social History Narrative   One child living. One son deceased secondary to accident.    Review of Systems: See HPI, otherwise negative ROS  Physical Exam: There were no vitals taken for this visit. General:   Alert,  Well-developed, well-nourished, pleasant and cooperative in NAD Neck:  Supple; no masses or thyromegaly. No significant cervical adenopathy. Lungs:  Clear throughout to auscultation.   No wheezes, crackles, or rhonchi. No acute  distress. Heart:  Regular rate and rhythm; no murmurs, clicks, rubs,  or gallops. Abdomen: Non-distended, normal bowel sounds.  Soft and nontender without appreciable mass or hepatosplenomegaly.  Pulses:  Normal pulses noted. Extremities:  Without clubbing or edema.  Impression/Plan: history of colonic polyps. Here for surveillance colonoscopy.  The risks, benefits, limitations, alternatives and imponderables have been reviewed with the patient. Questions have been answered. All parties are agreeable.      Notice: This dictation was prepared with Dragon dictation along with smaller  Company secretary. Any transcriptional errors that result from this process are unintentional and may not be corrected upon review.

## 2017-08-06 NOTE — Op Note (Signed)
[facility] Patient Name: Heather Raymond Procedure Date: 08/06/2017 10:55 AM MRN: [id number] Date of Birth: 1955-04-01 Attending MD: Heather Raymond , MD CSN: [id number] Age: 62 Admit Type: Outpatient Procedure:                Colonoscopy Indications:              High risk colon cancer surveillance: Personal                            history of colonic polyps Providers:                Heather Richards, MD, Lurline Del, RN, Aram Candela Referring MD:              Medicines:                Midazolam 5 mg IV, Meperidine 50 mg IV Complications:            No immediate complications. Estimated Blood Loss:     Estimated blood loss: none. Procedure:                Pre-Anesthesia Assessment:                           - Prior to the procedure, a History and Physical                            was performed, and patient medications and                            allergies were reviewed. The patient's tolerance of                            previous anesthesia was also reviewed. The risks                            and benefits of the procedure and the sedation                            options and risks were discussed with the patient.                            All questions were answered, and informed consent                            was obtained. Prior Anticoagulants: The patient has                            taken no previous anticoagulant or antiplatelet                            agents. ASA Grade Assessment: II - A patient with  mild systemic disease. After reviewing the risks                            and benefits, the patient was deemed in                            satisfactory condition to undergo the procedure.                           After obtaining informed consent, the colonoscope                            was passed under direct vision. Throughout the                            procedure, the patient's  blood pressure, pulse, and                            oxygen saturations were monitored continuously. The                            EC-[id number] ([id number]) scope was introduced through                            the anus and advanced to the the cecum, identified                            by appendiceal orifice and ileocecal valve. The                            colonoscopy was performed without difficulty. The                            patient tolerated the procedure well. The quality                            of the bowel preparation was adequate. The                            ileocecal valve, appendiceal orifice, and rectum                            were photographed. The entire colon was well                            visualized. Scope In: 11:33:52 AM Scope Out: 11:44:00 AM Scope Withdrawal Time: 0 hours 6 minutes 25 seconds  Total Procedure Duration: 0 hours 10 minutes 8 seconds  Findings:      The perianal and digital rectal examinations were normal.      Scattered medium-mouthed diverticula were found in the sigmoid colon and       descending colon.      The exam was otherwise without abnormality on direct and retroflexion       views. Impression:               -  Diverticulosis in the sigmoid colon and in the                            descending colon.                           - The examination was otherwise normal on direct                            and retroflexion views.                           - No specimens collected. Moderate Sedation:      Moderate (conscious) sedation was administered by the endoscopy nurse       and supervised by the endoscopist. The following parameters were       monitored: oxygen saturation, heart rate, blood pressure, respiratory       rate, EKG, adequacy of pulmonary ventilation, and response to care.       Total physician intraservice time was 16 minutes. Recommendation:           - Patient has a contact number available for                             emergencies. The signs and symptoms of potential                            delayed complications were discussed with the                            patient. Return to normal activities tomorrow.                            Written discharge instructions were provided to the                            patient.                           - Resume previous diet.                           - Repeat colonoscopy in 7 years for screening                            purposes.                           - Return to GI office PRN. Procedure Code(s):        --- Professional ---                           [phone], Colonoscopy, flexible; diagnostic, including                            collection of specimen(s) by brushing or washing,  when performed (separate procedure)                           G0500, Moderate sedation services provided by the                            same physician or other qualified health care                            professional performing a gastrointestinal                            endoscopic service that sedation supports,                            requiring the presence of an independent trained                            observer to assist in the monitoring of the                            patient's level of consciousness and physiological                            status; initial 15 minutes of intra-service time;                            patient age 63 years or older (additional time may                            be reported with [phone], as appropriate) Diagnosis Code(s):        --- Professional ---                           Z86.010, Personal history of colonic polyps                           K57.30, Diverticulosis of large intestine without                            perforation or abscess without bleeding CPT copyright 2017 [employer]. All rights reserved. The codes documented in this report are preliminary and  upon coder review may  be revised to meet current compliance requirements. Heather Raymond. Heather Happel, MD Heather Richards, MD 08/06/2017 11:49:24 AM This report has been signed electronically. Number of Addenda: 0

## 2017-08-06 NOTE — Discharge Instructions (Signed)
Colonoscopy Discharge Instructions  Read the instructions outlined below and refer to this sheet in the next few weeks. These discharge instructions provide you with general information on caring for yourself after you leave the hospital. Your doctor may also give you specific instructions. While your treatment has been planned according to the most current medical practices available, unavoidable complications occasionally occur. If you have any problems or questions after discharge, call Dr. Gala Romney at [phone]. ACTIVITY  You may resume your regular activity, but move at a slower pace for the next 24 hours.   Take frequent rest periods for the next 24 hours.   Walking will help get rid of the air and reduce the bloated feeling in your belly (abdomen).   No driving for 24 hours (because of the medicine (anesthesia) used during the test).    Do not sign any important legal documents or operate any machinery for 24 hours (because of the anesthesia used during the test).  NUTRITION  Drink plenty of fluids.   You may resume your normal diet as instructed by your doctor.   Begin with a light meal and progress to your normal diet. Heavy or fried foods are harder to digest and may make you feel sick to your stomach (nauseated).   Avoid alcoholic beverages for 24 hours or as instructed.  MEDICATIONS  You may resume your normal medications unless your doctor tells you otherwise.  WHAT YOU CAN EXPECT TODAY  Some feelings of bloating in the abdomen.   Passage of more gas than usual.   Spotting of blood in your stool or on the toilet paper.  IF YOU HAD POLYPS REMOVED DURING THE COLONOSCOPY:  No aspirin products for 7 days or as instructed.   No alcohol for 7 days or as instructed.   Eat a soft diet for the next 24 hours.  FINDING OUT THE RESULTS OF YOUR TEST Not all test results are available during your visit. If your test results are not back during the visit, make an appointment  with your caregiver to find out the results. Do not assume everything is normal if you have not heard from your caregiver or the medical facility. It is important for you to follow up on all of your test results.  SEEK IMMEDIATE MEDICAL ATTENTION IF:  You have more than a spotting of blood in your stool.   Your belly is swollen (abdominal distention).   You are nauseated or vomiting.   You have a temperature over 101.   You have abdominal pain or discomfort that is severe or gets worse throughout the day.    Diverticulosis information provided  Repeat colonoscopy in 7 years     Diverticulosis Diverticulosis is a condition that develops when small pouches (diverticula) form in the wall of the large intestine (colon). The colon is where water is absorbed and stool is formed. The pouches form when the inside layer of the colon pushes through weak spots in the outer layers of the colon. You may have a few pouches or many of them. What are the causes? The cause of this condition is not known. What increases the risk? The following factors may make you more likely to develop this condition:  Being older than age 65. Your risk for this condition increases with age. Diverticulosis is rare among people younger than age 33. By age 43, many people have it.  Eating a low-fiber diet.  Having frequent constipation.  Being overweight.  Not getting enough exercise.  Smoking.  Taking over-the-counter pain medicines, like aspirin and ibuprofen.  Having a family history of diverticulosis.  What are the signs or symptoms? In most people, there are no symptoms of this condition. If you do have symptoms, they may include:  Bloating.  Cramps in the abdomen.  Constipation or diarrhea.  Pain in the lower left side of the abdomen.  How is this diagnosed? This condition is most often diagnosed during an exam for other colon problems. Because diverticulosis usually has no symptoms, it  often cannot be diagnosed independently. This condition may be diagnosed by:  Using a flexible scope to examine the colon (colonoscopy).  Taking an X-ray of the colon after dye has been put into the colon (barium enema).  Doing a CT scan.  How is this treated? You may not need treatment for this condition if you have never developed an infection related to diverticulosis. If you have had an infection before, treatment may include:  Eating a high-fiber diet. This may include eating more fruits, vegetables, and grains.  Taking a fiber supplement.  Taking a live bacteria supplement (probiotic).  Taking medicine to relax your colon.  Taking antibiotic medicines.  Follow these instructions at home:  Drink 6-8 glasses of water or more each day to prevent constipation.  Try not to strain when you have a bowel movement.  If you have had an infection before: ? Eat more fiber as directed by your health care provider or your diet and nutrition specialist (dietitian). ? Take a fiber supplement or probiotic, if your health care provider approves.  Take over-the-counter and prescription medicines only as told by your health care provider.  If you were prescribed an antibiotic, take it as told by your health care provider. Do not stop taking the antibiotic even if you start to feel better.  Keep all follow-up visits as told by your health care provider. This is important. Contact a health care provider if:  You have pain in your abdomen.  You have bloating.  You have cramps.  You have not had a bowel movement in 3 days. Get help right away if:  Your pain gets worse.  Your bloating becomes very bad.  You have a fever or chills, and your symptoms suddenly get worse.  You vomit.  You have bowel movements that are bloody or black.  You have bleeding from your rectum. Summary  Diverticulosis is a condition that develops when small pouches (diverticula) form in the wall of the  large intestine (colon).  You may have a few pouches or many of them.  This condition is most often diagnosed during an exam for other colon problems.  If you have had an infection related to diverticulosis, treatment may include increasing the fiber in your diet, taking supplements, or taking medicines. This information is not intended to replace advice given to you by your health care provider. Make sure you discuss any questions you have with your health care provider. Document Released: 10/19/2003 Document Revised: 12/11/2015 Document Reviewed: 12/11/2015 Elsevier Interactive Patient Education  2017 [employer].

## 2017-08-13 ENCOUNTER — Encounter (HOSPITAL_COMMUNITY): Payer: Self-pay | Admitting: Internal Medicine

## 2017-09-09 ENCOUNTER — Ambulatory Visit: Payer: BC Managed Care – PPO | Admitting: Nurse Practitioner

## 2017-11-21 ENCOUNTER — Ambulatory Visit: Payer: BC Managed Care – PPO | Admitting: Gastroenterology

## 2017-11-21 ENCOUNTER — Encounter: Payer: Self-pay | Admitting: Gastroenterology

## 2017-11-21 VITALS — BP 135/83 | HR 80 | Temp 97.1°F | Ht 63.0 in | Wt 155.8 lb

## 2017-11-21 DIAGNOSIS — R1011 Right upper quadrant pain: Secondary | ICD-10-CM

## 2017-11-21 DIAGNOSIS — G8929 Other chronic pain: Secondary | ICD-10-CM

## 2017-11-21 DIAGNOSIS — K5900 Constipation, unspecified: Secondary | ICD-10-CM | POA: Diagnosis not present

## 2017-11-21 NOTE — Patient Instructions (Signed)
1. Continue probiotics. If you start having problems again, please call and we will consider if you need further testing.

## 2017-11-21 NOTE — Assessment & Plan Note (Signed)
Issues with constipation, right upper quadrant bloating and fullness.  Symptoms have completely resolved on probiotics which she has been taking for about 2 weeks.  For now we will continue to monitor.  Patient in agreement.  If she has recurrent problems, she will let me know, consider further testing at that time.  Return to the office as needed.

## 2017-11-21 NOTE — Progress Notes (Signed)
Primary Care Physician: Celene Squibb, MD  Primary Gastroenterologist:  Garfield Cornea, MD   Chief Complaint  Patient presents with  . Abdominal Pain    RUQ, occas pain/fullnes    HPI: Heather Raymond is a 62 y.o. female here for further evaluation of constipation and recurrent right upper quadrant fullness, discomfort.  She has had some issues like this in the past, back in 2015 she had a ultrasound unremarkable gallbladder.  She tells me she almost canceled today's appointment because her symptoms have basically resolved after starting probiotic 2 weeks ago.  Denies abdominal pain.  She was having issues having bowel movements, but when she would go she would have loose stool.  Now her stools are regular, daily.  No blood in the stool or melena.  Heartburn resolved after probiotics as well.  No dysphagia or vomiting.  She tells me she has cystocele per her gynecologist does not feel like it is at the point of needing repair.  She had colonoscopy in 08/2017 showing diverticulosis. Next surveillance colonoscopy in 7 years.   Current Outpatient Medications  Medication Sig Dispense Refill  . acidophilus (RISAQUAD) CAPS capsule Take 1 capsule by mouth daily.    Marland Kitchen albuterol (PROAIR HFA) 108 (90 Base) MCG/ACT inhaler Inhale 4 puffs into the lungs every 4 (four) hours as needed for wheezing or shortness of breath. (Patient taking differently: Inhale 2 puffs into the lungs every 4 (four) hours as needed for wheezing or shortness of breath. ) 1 Inhaler 2  . Artificial Tear Solution (SOOTHE XP OP) Place 1 drop into both eyes 2 (two) times daily as needed (for dry eyes).    . budesonide-formoterol (SYMBICORT) 80-4.5 MCG/ACT inhaler Inhale 1 puff into the lungs 2 (two) times daily. (Patient taking differently: Inhale 2 puffs into the lungs 2 (two) times daily as needed (for shortness of breath or wheezing). ) 1 Inhaler 2  . Calcium Carbonate-Vitamin D (CALCIUM + D PO) Take 1 tablet by mouth daily.      . cetirizine (ZYRTEC) 10 MG tablet Take 10 mg by mouth daily.    Marland Kitchen CRANBERRY PO Take 1 capsule by mouth daily.     . fluticasone (FLONASE) 50 MCG/ACT nasal spray Place 1 spray into both nostrils daily as needed for allergies or rhinitis.    . Glucosamine HCl (GLUCOSAMINE PO) Take 1 tablet by mouth daily.     Marland Kitchen ibuprofen (ADVIL,MOTRIN) 200 MG tablet Take 400 mg by mouth every 6 (six) hours as needed for headache or moderate pain.    Marland Kitchen levothyroxine (SYNTHROID, LEVOTHROID) 75 MCG tablet Take 75 mcg by mouth daily before breakfast.     . Multiple Vitamins-Minerals (MULTIVITAMIN PO) Take 1 tablet by mouth daily.    Marland Kitchen telmisartan (MICARDIS) 80 MG tablet Take 80 mg by mouth daily.     No current facility-administered medications for this visit.     Allergies as of 11/21/2017 - Review Complete 11/21/2017  Allergen Reaction Noted  . Other Anaphylaxis and Other (See Comments) 12/03/2016  . Sulfa antibiotics Rash 12/17/2011    ROS:  General: Negative for anorexia, weight loss, fever, chills, fatigue, weakness. ENT: Negative for hoarseness, difficulty swallowing , nasal congestion. CV: Negative for chest pain, angina, palpitations, dyspnea on exertion, peripheral edema.  Respiratory: Negative for dyspnea at rest, dyspnea on exertion, cough, sputum, wheezing.  GI: See history of present illness. GU:  Negative for dysuria, hematuria, urinary incontinence, urinary frequency, nocturnal urination.  Endo: Negative  for unusual weight change.    Physical Examination:   BP 135/83   Pulse 80   Temp (!) 97.1 F (36.2 C) (Oral)   Ht 5\' 3"  (1.6 m)   Wt 155 lb 12.8 oz (70.7 kg)   BMI 27.60 kg/m   General: Well-nourished, well-developed in no acute distress.  Eyes: No icterus. Mouth: Oropharyngeal mucosa moist and pink , no lesions erythema or exudate. Lungs: Clear to auscultation bilaterally.  Heart: Regular rate and rhythm, no murmurs rubs or gallops.  Abdomen: Bowel sounds are normal,  nontender, nondistended, no hepatosplenomegaly or masses, no abdominal bruits or hernia , no rebound or guarding.   Extremities: No lower extremity edema. No clubbing or deformities. Neuro: Alert and oriented x 4   Skin: Warm and dry, no jaundice.   Psych: Alert and cooperative, normal mood and affect.

## 2017-11-24 NOTE — Progress Notes (Signed)
CC'D TO PCP °

## 2017-12-23 ENCOUNTER — Telehealth: Payer: Self-pay | Admitting: Obstetrics and Gynecology

## 2017-12-23 NOTE — Telephone Encounter (Signed)
Pt called and stated that at her last visit in January this year her and Ferg had discussed her being referred to a Urologist in [place]. She is not getting better and is ready to have that referrral. Please contact pt

## 2017-12-24 ENCOUNTER — Other Ambulatory Visit: Payer: Self-pay | Admitting: *Deleted

## 2017-12-24 NOTE — Telephone Encounter (Signed)
Patient informed referral will be faxed today.

## 2018-04-07 ENCOUNTER — Ambulatory Visit: Payer: BC Managed Care – PPO | Admitting: Urology

## 2018-04-07 DIAGNOSIS — N3946 Mixed incontinence: Secondary | ICD-10-CM

## 2018-04-07 DIAGNOSIS — N8111 Cystocele, midline: Secondary | ICD-10-CM

## 2018-07-02 ENCOUNTER — Other Ambulatory Visit: Payer: Self-pay | Admitting: Urology

## 2018-07-07 ENCOUNTER — Other Ambulatory Visit: Payer: Self-pay | Admitting: Urology

## 2018-07-07 ENCOUNTER — Other Ambulatory Visit (HOSPITAL_COMMUNITY): Payer: Self-pay | Admitting: Urology

## 2018-07-07 DIAGNOSIS — N361 Urethral diverticulum: Secondary | ICD-10-CM

## 2018-07-13 ENCOUNTER — Ambulatory Visit (HOSPITAL_COMMUNITY)
Admission: RE | Admit: 2018-07-13 | Discharge: 2018-07-13 | Disposition: A | Discharge status: Home / Self Care | Payer: BC Managed Care – PPO | Source: Ambulatory Visit | Attending: Urology | Admitting: Urology

## 2018-07-13 ENCOUNTER — Other Ambulatory Visit: Payer: Self-pay

## 2018-07-13 DIAGNOSIS — N361 Urethral diverticulum: Secondary | ICD-10-CM | POA: Diagnosis not present

## 2018-07-13 LAB — POCT I-STAT CREATININE: Creatinine, Ser: 0.7 mg/dL (ref 0.44–1.00)

## 2018-07-13 MED ORDER — GADOBUTROL 1 MMOL/ML IV SOLN
7.0000 mL | Freq: Once | INTRAVENOUS | Status: AC | PRN
  Administered 2018-07-13: 7 mL via INTRAVENOUS

## 2018-07-13 NOTE — Patient Instructions (Addendum)
Heather Raymond    Your procedure is scheduled on: 07-21-2018   Report to [facility] Main  Entrance    Report to SHORT STAY  At 530 AM   YOU NEED TO HAVE A COVID 19 TEST ON 07-17-18  @945  AM, THIS TEST MUST BE DONE BEFORE SURGERY, COME TO [place] ENTRANCE.    Call this number if you have problems the morning of surgery [phone]    Remember: Do not eat food or drink liquids :After Midnight.    BRUSH YOUR TEETH MORNING OF SURGERY AND RINSE YOUR MOUTH OUT, NO CHEWING GUM CANDY OR MINTS.     Take these medicines the morning of surgery with A SIP OF WATER: LEVOTHYROXINE (SYNTHROID). YOU MAY USE AND BRING YOUR ALBUTEROL AND SYMBICORT INHALER, AS WELL AS YOUR NASAL SPRAY                You may not have any metal on your body including hair pins and              piercings     Do not wear jewelry, make-up, lotions, powders or perfumes, deodorant             Do not wear nail polish.  Do not shave  48 hours prior to surgery.               Do not bring valuables to the hospital. [place].  Contacts, dentures or bridgework may not be worn into surgery.   Special Instructions: [name]              Please read over the following fact sheets you were given: _____________________________________________________________________             [facility] - Preparing for Surgery Before surgery, you can play an important role.  Because skin is not sterile, your skin needs to be as free of germs as possible.  You can reduce the number of germs on your skin by washing with CHG (chlorahexidine gluconate) soap before surgery.  CHG is an antiseptic cleaner which kills germs and bonds with the skin to continue killing germs even after washing. Please DO NOT use if you have an allergy to CHG or antibacterial soaps.  If your skin becomes reddened/irritated stop using the CHG and inform your nurse when  you arrive at Short Stay. Do not shave (including legs and underarms) for at least 48 hours prior to the first CHG shower.  You may shave your face/neck. Please follow these instructions carefully:  1.  Shower with CHG Soap the night before surgery and the  morning of Surgery.  2.  If you choose to wash your hair, wash your hair first as usual with your  normal  shampoo.  3.  After you shampoo, rinse your hair and body thoroughly to remove the  shampoo.                           4.  Use CHG as you would any other liquid soap.  You can apply chg directly  to the skin and wash                       Gently  with a scrungie or clean washcloth.  5.  Apply the CHG Soap to your body ONLY FROM THE NECK DOWN.   Do not use on face/ open                           Wound or open sores. Avoid contact with eyes, ears mouth and genitals (private parts).                       Wash face,  Genitals (private parts) with your normal soap.             6.  Wash thoroughly, paying special attention to the area where your surgery  will be performed.  7.  Thoroughly rinse your body with warm water from the neck down.  8.  DO NOT shower/wash with your normal soap after using and rinsing off  the CHG Soap.                9.  Pat yourself dry with a clean towel.            10.  Wear clean pajamas.            11.  Place clean sheets on your bed the night of your first shower and do not  sleep with pets. Day of Surgery : Do not apply any lotions/deodorants the morning of surgery.  Please wear clean clothes to the hospital/surgery center.  FAILURE TO FOLLOW THESE INSTRUCTIONS MAY RESULT IN THE CANCELLATION OF YOUR SURGERY PATIENT SIGNATURE_________________________________  NURSE SIGNATURE__________________________________  ________________________________________________________________________  WHAT IS A BLOOD TRANSFUSION? Blood Transfusion Information  A transfusion is the replacement of blood or some of its parts.  Blood is made up of multiple cells which provide different functions.  Red blood cells carry oxygen and are used for blood loss replacement.  White blood cells fight against infection.  Platelets control bleeding.  Plasma helps clot blood.  Other blood products are available for specialized needs, such as hemophilia or other clotting disorders. BEFORE THE TRANSFUSION  Who gives blood for transfusions?   Healthy volunteers who are fully evaluated to make sure their blood is safe. This is blood bank blood. Transfusion therapy is the safest it has ever been in the practice of medicine. Before blood is taken from a donor, a complete history is taken to make sure that person has no history of diseases nor engages in risky social behavior (examples are intravenous drug use or sexual activity with multiple partners). The donor's travel history is screened to minimize risk of transmitting infections, such as malaria. The donated blood is tested for signs of infectious diseases, such as HIV and hepatitis. The blood is then tested to be sure it is compatible with you in order to minimize the chance of a transfusion reaction. If you or a relative donates blood, this is often done in anticipation of surgery and is not appropriate for emergency situations. It takes many days to process the donated blood. RISKS AND COMPLICATIONS Although transfusion therapy is very safe and saves many lives, the main dangers of transfusion include:   Getting an infectious disease.  Developing a transfusion reaction. This is an allergic reaction to something in the blood you were given. Every precaution is taken to prevent this. The decision to have a blood transfusion has been considered carefully by your caregiver before blood is given. Blood is not given unless the benefits outweigh the  risks. AFTER THE TRANSFUSION  Right after receiving a blood transfusion, you will usually feel much better and more energetic. This is  especially true if your red blood cells have gotten low (anemic). The transfusion raises the level of the red blood cells which carry oxygen, and this usually causes an energy increase.  The nurse administering the transfusion will monitor you carefully for complications. HOME CARE INSTRUCTIONS  No special instructions are needed after a transfusion. You may find your energy is better. Speak with your caregiver about any limitations on activity for underlying diseases you may have. SEEK MEDICAL CARE IF:   Your condition is not improving after your transfusion.  You develop redness or irritation at the intravenous (IV) site. SEEK IMMEDIATE MEDICAL CARE IF:  Any of the following symptoms occur over the next 12 hours:  Shaking chills.  You have a temperature by mouth above 102 F (38.9 C), not controlled by medicine.  Chest, back, or muscle pain.  People around you feel you are not acting correctly or are confused.  Shortness of breath or difficulty breathing.  Dizziness and fainting.  You get a rash or develop hives.  You have a decrease in urine output.  Your urine turns a dark color or changes to pink, red, or brown. Any of the following symptoms occur over the next 10 days:  You have a temperature by mouth above 102 F (38.9 C), not controlled by medicine.  Shortness of breath.  Weakness after normal activity.  The white part of the eye turns yellow (jaundice).  You have a decrease in the amount of urine or are urinating less often.  Your urine turns a dark color or changes to pink, red, or brown. Document Released: 01/19/2000 Document Revised: 04/15/2011 Document Reviewed: 09/07/2007 [facility] Patient Information 2014 [place], [place].  _______________________________________________________________________

## 2018-07-14 ENCOUNTER — Encounter (HOSPITAL_COMMUNITY): Payer: Self-pay

## 2018-07-14 ENCOUNTER — Encounter (HOSPITAL_COMMUNITY)
Admission: RE | Admit: 2018-07-14 | Discharge: 2018-07-14 | Disposition: A | Discharge status: Home / Self Care | Payer: BC Managed Care – PPO | Source: Ambulatory Visit | Attending: Urology | Admitting: Urology

## 2018-07-14 ENCOUNTER — Other Ambulatory Visit: Payer: Self-pay

## 2018-07-14 DIAGNOSIS — I10 Essential (primary) hypertension: Secondary | ICD-10-CM | POA: Diagnosis not present

## 2018-07-14 DIAGNOSIS — N393 Stress incontinence (female) (male): Secondary | ICD-10-CM | POA: Insufficient documentation

## 2018-07-14 DIAGNOSIS — Z01818 Encounter for other preprocedural examination: Secondary | ICD-10-CM | POA: Insufficient documentation

## 2018-07-14 DIAGNOSIS — N8110 Cystocele, unspecified: Secondary | ICD-10-CM | POA: Insufficient documentation

## 2018-07-14 LAB — CBC
HCT: 43 % (ref 36.0–46.0)
Hemoglobin: 14.1 g/dL (ref 12.0–15.0)
MCH: 31 pg (ref 26.0–34.0)
MCHC: 32.8 g/dL (ref 30.0–36.0)
MCV: 94.5 fL (ref 80.0–100.0)
Platelets: 252 10*3/uL (ref 150–400)
RBC: 4.55 MIL/uL (ref 3.87–5.11)
RDW: 12.6 % (ref 11.5–15.5)
WBC: 6.4 10*3/uL (ref 4.0–10.5)
nRBC: 0 % (ref 0.0–0.2)

## 2018-07-14 LAB — ABO/RH: ABO/RH(D): O POS

## 2018-07-14 LAB — BASIC METABOLIC PANEL
Anion gap: 5 (ref 5–15)
BUN: 19 mg/dL (ref 8–23)
CO2: 27 mmol/L (ref 22–32)
Calcium: 9.3 mg/dL (ref 8.9–10.3)
Chloride: 109 mmol/L (ref 98–111)
Creatinine, Ser: 0.71 mg/dL (ref 0.44–1.00)
GFR calc Af Amer: >60 mL/min (ref >60)
GFR calc non Af Amer: >60 mL/min (ref >60)
Glucose, Bld: 91 mg/dL (ref 70–99)
Potassium: 4.2 mmol/L (ref 3.5–5.1)
Sodium: 141 mmol/L (ref 135–145)

## 2018-07-14 LAB — PROTIME-INR
INR: 0.9 (ref 0.8–1.2)
Prothrombin Time: 12.5 seconds (ref 11.4–15.2)

## 2018-07-17 ENCOUNTER — Other Ambulatory Visit: Payer: Self-pay

## 2018-07-17 ENCOUNTER — Other Ambulatory Visit (HOSPITAL_COMMUNITY)
Admission: RE | Admit: 2018-07-17 | Discharge: 2018-07-17 | Disposition: A | Discharge status: Home / Self Care | Payer: BC Managed Care – PPO | Source: Ambulatory Visit | Attending: Urology | Admitting: Urology

## 2018-07-17 DIAGNOSIS — Z01812 Encounter for preprocedural laboratory examination: Secondary | ICD-10-CM | POA: Insufficient documentation

## 2018-07-17 DIAGNOSIS — Z1159 Encounter for screening for other viral diseases: Secondary | ICD-10-CM | POA: Diagnosis not present

## 2018-07-18 LAB — NOVEL CORONAVIRUS, NAA (HOSP ORDER, SEND-OUT TO REF LAB; TAT 18-24 HRS): SARS-CoV-2, NAA: NOT DETECTED

## 2018-07-19 NOTE — H&P (Signed)
I was consulted by the above provider to assess the patient's prolapse worsening over 8 months and urinary incontinence. She leaks sometimes with coughing sneezing but much worse when she has bronchitis. Primary incontinence is urge incontinence but not on every occasion. She wears 1 or 2 damp liners a day and denies bedwetting. She does much better when she time voids   She voids every 1 hour cannot hold it for 2 hours gets up twice a night reports good flow   She feels vaginal bulging. Sometimes she reduce it. She does not do splinting maneuvers. She has had a hysterectomy.   She thinks she has had 3 bladder infections with dysuria and blood with wiping since September. They respond favorably antibiotics. She has never smoked. She does not take daily aspirin or blood thinners   On physical examination the patient had a large grade two or small grade 3 cystocele with modest central defect. Her vaginal cuff descended for major 9 cm to approximately 6 cm she could not do a Valsalva without tensing up. With the cystocele reduced she did not have stress incontinence after cystoscopy. At 5 and 7:00 a.m. she had vaginal epithelial elevations that were quite soft and elevated and likely not diverticuli but they could be. Relatively she had a lot of folds in that area and 1 could argue little bit of shortening of the urethra. She had diffuse grade 1 rectocele.   Cystoscopy normal   The patient has milder mixed incontinence and reports urge incontinence most significant though she leaked a lot with the bronchitis. She will return for urodynamics. I will also get a CT scan and call if abnormal because of the recurrent urinary tract infections and history of blood in the urine. I did not tell her about the pathophysiology of chronic cystitis but will next time   A picture was drawn. If she ever had prolapse surgery she would need to be consented for a transvaginal vault suspension with cystocele repair and  graft. The vault suspension would depend upon the intraoperative findings. She would not need a rectocele repair. If she was going to have a sling I would order an MRI and she knows this the my index of suspicion is low.   TOday  Prolapse stable. Incontinence stable.  On urodynamics she emptied efficiently. Maximum bladder capacity is 440 mL. Her bladder was unstable reaching pressure 5 cm water. She had urgency but did not leak. She did not have stress incontinence with the prolapse reduced at 104 cm of water. She had difficulty voiding in the lab setting. She voided 376 mL with a max flow of 15 mL/second. Maximum voiding pressures 8 cm water. Residual was 50 mL. EMG activity was increased during voiding. Bladder neck descended 2 or 3 cm and she had a moderate cystocele printed had wondered if she could have a small urethral diverticulum and placed in a row on it. The details of the urodynamics or sign dictated   The patient is symptomatic prolapse. She has mild mixed incontinence. She has mild stress incontinence worse clinically when she has bronchitis. She might have a small diverticulum.   Picture was drawn. I discussed a transvaginal vault suspension with cystocele repair and graph versus a pessary versus watchful waiting. The role of physical therapy and exercises and watchful waiting and sling were discussed. The role of an MRI was discussed only necessary if she wants to have a sling.   Patient is sexually active. She remember the issue  about the MRI. She wants to think about it. Natural history discussed. If she wants surgery she will call. If she wants surgery she will also let us know if she wants a sling. If she wants a sling we will get the MRI. See p.r.n.     ALLERGIES: SULFA - Skin Rash    MEDICATIONS: Calcium + D  Cranberry  Glucosamine  Synthroid 75 mcg tablet  Telmisartan 80 mg tablet     GU PSH: Complex cystometrogram, w/ void pressure and urethral pressure profile studies,  any technique - 04/24/2018 Complex Uroflow - 04/24/2018 Cystoscopy - 04/21/2018 Emg surf Electrd - 04/24/2018 Inject For cystogram - 04/24/2018 Intrabd voidng Press - 04/24/2018 Locm 300-399Mg/Ml Iodine,1Ml - 06/01/2018      PSH Notes: hysterectomy  bunion surgery   NON-GU PSH: Appendectomy (laparoscopic)    GU PMH: Nocturia - 04/24/2018 Urinary Frequency - 04/24/2018 Gross hematuria - 04/21/2018 Cystocele, midline, Does have significant cystocele, minimal rectocele. This is quite symptomatic both stress - 04/07/2018 Mixed incontinence, She does have both stress and urgency incontinence, although the former seems to be dominant. - 04/07/2018    NON-GU PMH: Asthma Hypertension Hypothyroidism    FAMILY HISTORY: Death In The Family Father - Father Death In The Family Mother - Mother Dementia - Mother Diabetes - Grandfather multiple myeloma - Father   SOCIAL HISTORY: Marital Status: Married Preferred Language: English; Ethnicity: Not Hispanic Or Latino; Race: White Current Smoking Status: Patient has never smoked.   Tobacco Use Assessment Completed: Used Tobacco in last 30 days? Does not use smokeless tobacco. Has never drank.  Does not use drugs. Does not drink caffeine.    REVIEW OF SYSTEMS:    GU Review Female:   Patient reports frequent urination, get up at night to urinate, and leakage of urine. Patient denies hard to postpone urination, burning /pain with urination, stream starts and stops, trouble starting your stream, have to strain to urinate, and being pregnant.  Gastrointestinal (Upper):   Patient denies nausea, vomiting, and indigestion/ heartburn.  Gastrointestinal (Lower):   Patient denies diarrhea and constipation.  Constitutional:   Patient denies fever, night sweats, weight loss, and fatigue.  Skin:   Patient denies skin rash/ lesion and itching.  Eyes:   Patient denies blurred vision and double vision.  Ears/ Nose/ Throat:   Patient denies sore throat and sinus  problems.  Hematologic/Lymphatic:   Patient denies swollen glands and easy bruising.  Cardiovascular:   Patient denies leg swelling and chest pains.  Respiratory:   Patient denies cough and shortness of breath.  Endocrine:   Patient denies excessive thirst.  Musculoskeletal:   Patient denies back pain and joint pain.  Neurological:   Patient denies headaches and dizziness.  Psychologic:   Patient denies depression and anxiety.   VITAL SIGNS:      06/04/2018 08:57 AM  Weight 148.4 lb / 67.31 kg  Height 63 in / 160.02 cm  BP 138/81 mmHg  Pulse 74 /min  Temperature 97.8 F / 36.5 C  BMI 26.3 kg/m   PAST DATA REVIEWED:  Source Of History:  Patient   PROCEDURES:          Urinalysis w/Scope Dipstick Dipstick Cont'd Micro  Color: Straw Bilirubin: Neg mg/dL WBC/hpf: 0 - 5/hpf  Appearance: Clear Ketones: Neg mg/dL RBC/hpf: 0 - 2/hpf  Specific Gravity: <=1.005 Blood: Neg ery/uL Bacteria: Few (10-25/hpf)  pH: 5.5 Protein: Neg mg/dL Cystals: NS (Not Seen)  Glucose: Neg mg/dL Urobilinogen: 0.2 mg/dL Casts: NS (  Not Seen)    Nitrites: Neg Trichomonas: Not Present    Leukocyte Esterase: 1+ leu/uL Mucous: Not Present      Epithelial Cells: 0 - 5/hpf      Yeast: NS (Not Seen)      Sperm: Not Present    ASSESSMENT:      ICD-10 Details  1 GU:   Cystocele, midline - N81.11   2   Mixed incontinence - N39.46               Notes:   I drew her a picture and we talked about prolapse surgery in detail. Pros, cons, general surgical and anesthetic risks, and other options including behavioral therapy, pessaries, and watchful waiting were discussed. She understands that prolapse repairs are successful in 80-85% of cases for prolapse symptoms and can recur anteriorly, posteriorly, and/or apically. She understands that in most cases I use a graft and general risks were discussed. Surgical risks were described but not limited to the discussion of injury to neighboring structures including the bowel (with  possible life-threatening sepsis and colostomy), bladder, urethra, vagina (all resulting in further surgery), and ureter (resulting in re-implantation). We talked about injury to nerves/soft tissue leading to debilitating and intractable pelvic, abdominal, and lower extremity pain syndromes and neuropathies. The risks of buttock pain, intractable dyspareunia, and vaginal narrowing and shortening with sequelae were discussed. Bleeding risks, transfusion rates, and infection were discussed. The risk of persistent, de novo, or worsening bladder and/or bowel incontinence/dysfunction was discussed. The need for CIC was described as well the usual post-operative course. The patient understands that she might not reach her treatment goal and that she might be worse following surgery.   We talked about a sling in detail. Pros, cons, general surgical and anesthetic risks, and other options including behavioral therapy and watchful waiting were discussed. She understands that slings are generally successful in 90% of cases for stress incontinence, 50% for urge incontinence, and that in a small % of cases the incontinence can worsen. The risk of persistent, de novo, or worsening incontinence/dysfunction was discussed. Risks were described but not limited to the discussion of injury to neighboring structures including the bowel (with possible life-threatening sepsis and colostomy), bladder, urethra, vagina (all resulting in further surgery), and ureter (resulting in re-implantation). We also talked about the risk of retention requiring urethrolysis, extrusion requiring revision, and erosion resulting in further surgery. Bleeding risks and transfusion rates and the risk of infection were discussed. The risk of pelvic and abdominal pain syndromes, dyspareunia, and neuropathies were discussed. The need for CIC was described as well the usual post-operative course. The patient understands that she might not reach her treatment  goal and that she might be worse following surgery.   After a thorough review of the management options for the patient's condition the patient  elected to proceed with surgical therapy as noted above. We have discussed the potential benefits and risks of the procedure, side effects of the proposed treatment, the likelihood of the patient achieving the goals of the procedure, and any potential problems that might occur during the procedure or recuperation. Informed consent has been obtained.

## 2018-07-20 NOTE — Progress Notes (Signed)
SPOKE W/  Patient via phone     SCREENING SYMPTOMS OF COVID 19:   COUGH--no  RUNNY NOSE--- no  SORE THROAT---no  NASAL CONGESTION----no  SNEEZING----no  SHORTNESS OF BREATH---no  DIFFICULTY BREATHING---no  TEMP >100.0 -----no  UNEXPLAINED BODY ACHES------no  CHILLS -------- no  HEADACHES ---------no  LOSS OF SMELL/ TASTE --------no    HAVE YOU OR ANY FAMILY MEMBER TRAVELLED PAST 14 DAYS OUT OF THE   COUNTY---yes. Between [place] and [place] STATE----no COUNTRY----no  HAVE YOU OR ANY FAMILY MEMBER BEEN EXPOSED TO ANYONE WITH COVID 19? no

## 2018-07-21 ENCOUNTER — Ambulatory Visit (HOSPITAL_COMMUNITY): Payer: BC Managed Care – PPO | Admitting: Anesthesiology

## 2018-07-21 ENCOUNTER — Observation Stay (HOSPITAL_COMMUNITY)
Admission: RE | Admit: 2018-07-21 | Discharge: 2018-07-22 | Disposition: A | Discharge status: Home / Self Care | Payer: BC Managed Care – PPO | Attending: Urology | Admitting: Urology

## 2018-07-21 ENCOUNTER — Encounter (HOSPITAL_COMMUNITY): Payer: Self-pay | Admitting: *Deleted

## 2018-07-21 ENCOUNTER — Encounter (HOSPITAL_COMMUNITY)
Admission: RE | Disposition: A | Discharge status: Home / Self Care | Payer: Self-pay | Source: Home / Self Care | Attending: Urology

## 2018-07-21 ENCOUNTER — Other Ambulatory Visit: Payer: Self-pay

## 2018-07-21 ENCOUNTER — Ambulatory Visit (HOSPITAL_COMMUNITY): Payer: BC Managed Care – PPO | Admitting: Physician Assistant

## 2018-07-21 DIAGNOSIS — Z90710 Acquired absence of both cervix and uterus: Secondary | ICD-10-CM | POA: Diagnosis not present

## 2018-07-21 DIAGNOSIS — N393 Stress incontinence (female) (male): Secondary | ICD-10-CM | POA: Diagnosis not present

## 2018-07-21 DIAGNOSIS — E039 Hypothyroidism, unspecified: Secondary | ICD-10-CM | POA: Insufficient documentation

## 2018-07-21 DIAGNOSIS — Z820 Family history of epilepsy and other diseases of the nervous system: Secondary | ICD-10-CM | POA: Diagnosis not present

## 2018-07-21 DIAGNOSIS — J45909 Unspecified asthma, uncomplicated: Secondary | ICD-10-CM | POA: Diagnosis not present

## 2018-07-21 DIAGNOSIS — Z79899 Other long term (current) drug therapy: Secondary | ICD-10-CM | POA: Diagnosis not present

## 2018-07-21 DIAGNOSIS — Z833 Family history of diabetes mellitus: Secondary | ICD-10-CM | POA: Insufficient documentation

## 2018-07-21 DIAGNOSIS — Z791 Long term (current) use of non-steroidal anti-inflammatories (NSAID): Secondary | ICD-10-CM | POA: Diagnosis not present

## 2018-07-21 DIAGNOSIS — Z91018 Allergy to other foods: Secondary | ICD-10-CM | POA: Diagnosis not present

## 2018-07-21 DIAGNOSIS — N8111 Cystocele, midline: Principal | ICD-10-CM | POA: Insufficient documentation

## 2018-07-21 DIAGNOSIS — N814 Uterovaginal prolapse, unspecified: Secondary | ICD-10-CM | POA: Diagnosis present

## 2018-07-21 DIAGNOSIS — I10 Essential (primary) hypertension: Secondary | ICD-10-CM | POA: Diagnosis not present

## 2018-07-21 DIAGNOSIS — Z808 Family history of malignant neoplasm of other organs or systems: Secondary | ICD-10-CM | POA: Diagnosis not present

## 2018-07-21 DIAGNOSIS — Z882 Allergy status to sulfonamides status: Secondary | ICD-10-CM | POA: Insufficient documentation

## 2018-07-21 HISTORY — PX: CYSTOCELE REPAIR: SHX163

## 2018-07-21 LAB — TYPE AND SCREEN
ABO/RH(D): O POS
Antibody Screen: NEGATIVE

## 2018-07-21 LAB — HEMOGLOBIN AND HEMATOCRIT, BLOOD
HCT: 40.8 % (ref 36.0–46.0)
Hemoglobin: 13.4 g/dL (ref 12.0–15.0)

## 2018-07-21 SURGERY — COLPORRHAPHY, ANTERIOR, FOR CYSTOCELE REPAIR
Anesthesia: General

## 2018-07-21 MED ORDER — ACETAMINOPHEN 325 MG PO TABS
650.0000 mg | ORAL_TABLET | ORAL | Status: DC | PRN
  Administered 2018-07-21 – 2018-07-22 (×2): 650 mg via ORAL
  Filled 2018-07-21 (×2): qty 2

## 2018-07-21 MED ORDER — MIDAZOLAM HCL 5 MG/5ML IJ SOLN
INTRAMUSCULAR | Status: DC | PRN
  Administered 2018-07-21: 2 mg via INTRAVENOUS

## 2018-07-21 MED ORDER — PROPOFOL 10 MG/ML IV BOLUS
INTRAVENOUS | Status: AC
  Filled 2018-07-21: qty 60

## 2018-07-21 MED ORDER — PROPOFOL 500 MG/50ML IV EMUL
INTRAVENOUS | Status: DC | PRN
  Administered 2018-07-21: 25 ug/kg/min via INTRAVENOUS

## 2018-07-21 MED ORDER — HYDROCODONE-ACETAMINOPHEN 5-325 MG PO TABS: 1.0000 | ORAL_TABLET | ORAL | Status: DC | PRN

## 2018-07-21 MED ORDER — PHENYLEPHRINE HCL (PRESSORS) 10 MG/ML IV SOLN
INTRAVENOUS | Status: AC
  Filled 2018-07-21: qty 1

## 2018-07-21 MED ORDER — DIPHENHYDRAMINE HCL 50 MG/ML IJ SOLN: 12.5000 mg | Freq: Four times a day (QID) | INTRAMUSCULAR | Status: DC | PRN

## 2018-07-21 MED ORDER — ONDANSETRON HCL 4 MG/2ML IJ SOLN
INTRAMUSCULAR | Status: DC | PRN
  Administered 2018-07-21: 4 mg via INTRAVENOUS

## 2018-07-21 MED ORDER — DEXTROSE-NACL 5-0.45 % IV SOLN
INTRAVENOUS | Status: DC
  Administered 2018-07-21 – 2018-07-22 (×2): via INTRAVENOUS

## 2018-07-21 MED ORDER — IRBESARTAN 300 MG PO TABS
300.0000 mg | ORAL_TABLET | Freq: Every day | ORAL | Status: DC
  Administered 2018-07-21 – 2018-07-22 (×2): 300 mg via ORAL
  Filled 2018-07-21 (×2): qty 1

## 2018-07-21 MED ORDER — ONDANSETRON HCL 4 MG/2ML IJ SOLN
INTRAMUSCULAR | Status: AC
  Filled 2018-07-21: qty 2

## 2018-07-21 MED ORDER — PHENYLEPHRINE 40 MCG/ML (10ML) SYRINGE FOR IV PUSH (FOR BLOOD PRESSURE SUPPORT)
PREFILLED_SYRINGE | INTRAVENOUS | Status: DC | PRN
  Administered 2018-07-21 (×4): 80 ug via INTRAVENOUS

## 2018-07-21 MED ORDER — DOCUSATE SODIUM 100 MG PO CAPS
100.0000 mg | ORAL_CAPSULE | Freq: Two times a day (BID) | ORAL | Status: DC
  Administered 2018-07-21 – 2018-07-22 (×2): 100 mg via ORAL
  Filled 2018-07-21 (×2): qty 1

## 2018-07-21 MED ORDER — ALBUTEROL SULFATE (2.5 MG/3ML) 0.083% IN NEBU: 2.5000 mg | INHALATION_SOLUTION | RESPIRATORY_TRACT | Status: DC | PRN

## 2018-07-21 MED ORDER — SUCCINYLCHOLINE CHLORIDE 200 MG/10ML IV SOSY
PREFILLED_SYRINGE | INTRAVENOUS | Status: DC | PRN
  Administered 2018-07-21: 100 mg via INTRAVENOUS

## 2018-07-21 MED ORDER — EPHEDRINE SULFATE-NACL 50-0.9 MG/10ML-% IV SOSY
PREFILLED_SYRINGE | INTRAVENOUS | Status: DC | PRN
  Administered 2018-07-21 (×2): 10 mg via INTRAVENOUS

## 2018-07-21 MED ORDER — LIDOCAINE-EPINEPHRINE (PF) 1 %-1:200000 IJ SOLN
INTRAMUSCULAR | Status: AC
  Filled 2018-07-21: qty 60

## 2018-07-21 MED ORDER — FENTANYL CITRATE (PF) 250 MCG/5ML IJ SOLN
INTRAMUSCULAR | Status: DC | PRN
  Administered 2018-07-21 (×2): 50 ug via INTRAVENOUS
  Administered 2018-07-21: 100 ug via INTRAVENOUS
  Administered 2018-07-21: 50 ug via INTRAVENOUS

## 2018-07-21 MED ORDER — ROCURONIUM BROMIDE 10 MG/ML (PF) SYRINGE
PREFILLED_SYRINGE | INTRAVENOUS | Status: DC | PRN
  Administered 2018-07-21 (×2): 10 mg via INTRAVENOUS
  Administered 2018-07-21: 40 mg via INTRAVENOUS
  Administered 2018-07-21: 10 mg via INTRAVENOUS

## 2018-07-21 MED ORDER — CIPROFLOXACIN IN D5W 400 MG/200ML IV SOLN
400.0000 mg | INTRAVENOUS | Status: AC
  Administered 2018-07-21: 400 mg via INTRAVENOUS
  Filled 2018-07-21: qty 200

## 2018-07-21 MED ORDER — CLINDAMYCIN PHOSPHATE 2 % VA CREA
TOPICAL_CREAM | VAGINAL | Status: AC
  Filled 2018-07-21: qty 80

## 2018-07-21 MED ORDER — LACTATED RINGERS IV SOLN
INTRAVENOUS | Status: DC
  Administered 2018-07-21 (×2): via INTRAVENOUS

## 2018-07-21 MED ORDER — CLINDAMYCIN PHOSPHATE 600 MG/50ML IV SOLN
600.0000 mg | INTRAVENOUS | Status: AC
  Administered 2018-07-21: 600 mg via INTRAVENOUS
  Filled 2018-07-21: qty 50

## 2018-07-21 MED ORDER — CLINDAMYCIN PHOSPHATE 2 % VA CREA
TOPICAL_CREAM | VAGINAL | Status: DC | PRN
  Administered 2018-07-21: 2 via VAGINAL

## 2018-07-21 MED ORDER — MOMETASONE FURO-FORMOTEROL FUM 100-5 MCG/ACT IN AERO
2.0000 | INHALATION_SPRAY | Freq: Two times a day (BID) | RESPIRATORY_TRACT | Status: DC
  Administered 2018-07-21 – 2018-07-22 (×2): 2 via RESPIRATORY_TRACT
  Filled 2018-07-21: qty 8.8

## 2018-07-21 MED ORDER — ONDANSETRON HCL 4 MG/2ML IJ SOLN
4.0000 mg | INTRAMUSCULAR | Status: DC | PRN
  Administered 2018-07-21: 4 mg via INTRAVENOUS
  Filled 2018-07-21 (×2): qty 2

## 2018-07-21 MED ORDER — SODIUM CHLORIDE 0.9 % IV SOLN
INTRAVENOUS | Status: DC | PRN
  Administered 2018-07-21: 50 ug/min via INTRAVENOUS

## 2018-07-21 MED ORDER — FENTANYL CITRATE (PF) 250 MCG/5ML IJ SOLN
INTRAMUSCULAR | Status: AC
  Filled 2018-07-21: qty 5

## 2018-07-21 MED ORDER — PHENAZOPYRIDINE HCL 200 MG PO TABS
200.0000 mg | ORAL_TABLET | ORAL | Status: AC
  Administered 2018-07-21: 200 mg via ORAL
  Filled 2018-07-21: qty 1

## 2018-07-21 MED ORDER — LEVOTHYROXINE SODIUM 75 MCG PO TABS
75.0000 ug | ORAL_TABLET | Freq: Every day | ORAL | Status: DC
  Administered 2018-07-22: 75 ug via ORAL
  Filled 2018-07-21: qty 1

## 2018-07-21 MED ORDER — MIDAZOLAM HCL 2 MG/2ML IJ SOLN
INTRAMUSCULAR | Status: AC
  Filled 2018-07-21: qty 2

## 2018-07-21 MED ORDER — SUGAMMADEX SODIUM 200 MG/2ML IV SOLN
INTRAVENOUS | Status: AC
  Filled 2018-07-21: qty 2

## 2018-07-21 MED ORDER — HYDROCODONE-ACETAMINOPHEN 5-325 MG PO TABS: 1.0000 | ORAL_TABLET | Freq: Four times a day (QID) | ORAL | 0 refills | Status: DC | PRN

## 2018-07-21 MED ORDER — SUGAMMADEX SODIUM 200 MG/2ML IV SOLN
INTRAVENOUS | Status: DC | PRN
  Administered 2018-07-21: 200 mg via INTRAVENOUS

## 2018-07-21 MED ORDER — SODIUM CHLORIDE 0.9 % IV SOLN
INTRAVENOUS | Status: AC
  Filled 2018-07-21 (×2): qty 500000

## 2018-07-21 MED ORDER — SODIUM CHLORIDE 0.9 % IV SOLN
INTRAVENOUS | Status: DC | PRN
  Administered 2018-07-21: 500 mL

## 2018-07-21 MED ORDER — EPHEDRINE 5 MG/ML INJ
INTRAVENOUS | Status: AC
  Filled 2018-07-21: qty 10

## 2018-07-21 MED ORDER — METOPROLOL TARTRATE 5 MG/5ML IV SOLN
3.0000 mg | Freq: Once | INTRAVENOUS | Status: AC
  Administered 2018-07-21: 3 mg via INTRAVENOUS

## 2018-07-21 MED ORDER — ONDANSETRON HCL 4 MG/2ML IJ SOLN
4.0000 mg | Freq: Once | INTRAMUSCULAR | Status: AC
  Administered 2018-07-21: 4 mg via INTRAVENOUS

## 2018-07-21 MED ORDER — MORPHINE SULFATE (PF) 2 MG/ML IV SOLN: 2.0000 mg | INTRAVENOUS | Status: DC | PRN

## 2018-07-21 MED ORDER — METOPROLOL TARTRATE 5 MG/5ML IV SOLN
INTRAVENOUS | Status: AC
  Filled 2018-07-21: qty 5

## 2018-07-21 MED ORDER — LIDOCAINE-EPINEPHRINE (PF) 1 %-1:200000 IJ SOLN
INTRAMUSCULAR | Status: DC | PRN
  Administered 2018-07-21: 29 mL

## 2018-07-21 MED ORDER — PROPOFOL 10 MG/ML IV BOLUS
INTRAVENOUS | Status: DC | PRN
  Administered 2018-07-21: 140 mg via INTRAVENOUS

## 2018-07-21 MED ORDER — DIPHENHYDRAMINE HCL 12.5 MG/5ML PO ELIX: 12.5000 mg | ORAL_SOLUTION | Freq: Four times a day (QID) | ORAL | Status: DC | PRN

## 2018-07-21 MED ORDER — DEXAMETHASONE SODIUM PHOSPHATE 10 MG/ML IJ SOLN
INTRAMUSCULAR | Status: DC | PRN
  Administered 2018-07-21: 10 mg via INTRAVENOUS

## 2018-07-21 MED ORDER — STERILE WATER FOR IRRIGATION IR SOLN
Status: DC | PRN
  Administered 2018-07-21: 500 mL

## 2018-07-21 SURGICAL SUPPLY — 58 items
ADH SKN CLS APL DERMABOND .7 (GAUZE/BANDAGES/DRESSINGS) ×1
BAG DECANTER FOR FLEXI CONT (MISCELLANEOUS) ×3 IMPLANT
BAG URINE DRAINAGE (UROLOGICAL SUPPLIES) IMPLANT
BLADE SURG 15 STRL LF DISP TIS (BLADE) ×1 IMPLANT
BLADE SURG 15 STRL SS (BLADE) ×3
CATH FOLEY 2WAY SLVR  5CC 14FR (CATHETERS) ×2
CATH FOLEY 2WAY SLVR 5CC 14FR (CATHETERS) ×1 IMPLANT
COVER MAYO STAND STRL (DRAPES) ×3 IMPLANT
COVER WAND RF STERILE (DRAPES) IMPLANT
DECANTER SPIKE VIAL GLASS SM (MISCELLANEOUS) ×3 IMPLANT
DERMABOND ADVANCED (GAUZE/BANDAGES/DRESSINGS) ×2
DERMABOND ADVANCED .7 DNX12 (GAUZE/BANDAGES/DRESSINGS) IMPLANT
DEVICE CAPIO SLIM SINGLE (INSTRUMENTS) IMPLANT
DRAIN PENROSE 18X1/4 LTX STRL (WOUND CARE) ×3 IMPLANT
DRAPE SHEET LG 3/4 BI-LAMINATE (DRAPES) ×3 IMPLANT
ELECT PENCIL ROCKER SW 15FT (MISCELLANEOUS) ×3 IMPLANT
GAUZE 4X4 16PLY RFD (DISPOSABLE) ×6 IMPLANT
GAUZE PACKING 1 X5 YD ST (GAUZE/BANDAGES/DRESSINGS) ×2 IMPLANT
GAUZE PACKING 2X5 YD STRL (GAUZE/BANDAGES/DRESSINGS) ×3 IMPLANT
GAUZE SPONGE 4X4 16PLY XRAY LF (GAUZE/BANDAGES/DRESSINGS) ×6 IMPLANT
GLOVE BIO SURGEON STRL SZ 6.5 (GLOVE) ×2 IMPLANT
GLOVE BIO SURGEONS STRL SZ 6.5 (GLOVE) ×1
GLOVE BIOGEL M STRL SZ7.5 (GLOVE) ×3 IMPLANT
GLOVE ECLIPSE 8.5 STRL (GLOVE) ×3 IMPLANT
GOWN STRL REUS W/TWL XL LVL3 (GOWN DISPOSABLE) ×3 IMPLANT
HOLDER FOLEY CATH W/STRAP (MISCELLANEOUS) ×3 IMPLANT
IV NS 1000ML (IV SOLUTION) ×3
IV NS 1000ML BAXH (IV SOLUTION) ×1 IMPLANT
KIT BASIN OR (CUSTOM PROCEDURE TRAY) ×3 IMPLANT
KIT TURNOVER KIT A (KITS) IMPLANT
NDL MAYO 6 CRC TAPER PT (NEEDLE) ×1 IMPLANT
NEEDLE HYPO 22GX1.5 SAFETY (NEEDLE) ×3 IMPLANT
NEEDLE MAYO 6 CRC TAPER PT (NEEDLE) ×3 IMPLANT
NS IRRIG 1000ML POUR BTL (IV SOLUTION) ×3 IMPLANT
PACK CYSTO (CUSTOM PROCEDURE TRAY) ×3 IMPLANT
PLUG CATH AND CAP STER (CATHETERS) ×3 IMPLANT
RETRACTOR STAY HOOK 5MM (MISCELLANEOUS) ×3 IMPLANT
SHEET LAVH (DRAPES) ×3 IMPLANT
SLING SUPRIS RETROPUBIC KIT (Miscellaneous) ×2 IMPLANT
SUT CAPIO ETHIBPND (SUTURE) IMPLANT
SUT VIC AB 0 CT1 27 (SUTURE) ×6
SUT VIC AB 0 CT1 27XBRD ANTBC (SUTURE) ×1 IMPLANT
SUT VIC AB 2-0 CT1 27 (SUTURE) ×21
SUT VIC AB 2-0 CT1 27XBRD (SUTURE) ×2 IMPLANT
SUT VIC AB 2-0 CT1 TAPERPNT 27 (SUTURE) IMPLANT
SUT VIC AB 2-0 SH 27 (SUTURE) ×6
SUT VIC AB 2-0 SH 27X BRD (SUTURE) ×2 IMPLANT
SUT VIC AB 3-0 SH 27 (SUTURE) ×6
SUT VIC AB 3-0 SH 27XBRD (SUTURE) ×2 IMPLANT
SUT VIC AB 4-0 PS2 27 (SUTURE) ×4 IMPLANT
SUT VICRYL 0 UR6 27IN ABS (SUTURE) ×6 IMPLANT
SYR 10ML LL (SYRINGE) ×3 IMPLANT
TOWEL OR 17X26 10 PK STRL BLUE (TOWEL DISPOSABLE) ×3 IMPLANT
TOWEL OR NON WOVEN STRL DISP B (DISPOSABLE) ×3 IMPLANT
TUBING CONNECTING 10 (TUBING) ×2 IMPLANT
TUBING CONNECTING 10' (TUBING) ×1
WATER STERILE IRR 1000ML POUR (IV SOLUTION) ×3 IMPLANT
YANKAUER SUCT BULB TIP 10FT TU (MISCELLANEOUS) ×3 IMPLANT

## 2018-07-21 NOTE — Op Note (Signed)
Preoperative diagnosis: Cystocele and stress urinary incontinence Postoperative diagnosis: Cystocele and stress urinary incontinence Surgery: Cystocele repair and sling cystourethropexy and cystoscopy Surgeon: Dr. Nicki Reaper Carnel Stegman Assistant: Estill Bamberg dancy  The patient was prepped and draped in usual fashion.  Extra care was taken with leg positioning to minimize risk of compartment syndrome and neuropathy and deep vein thrombosis.  3-0 Vicryl suture was placed at the vaginal apex.  There is no question her vaginal apex was very well supported.  She had some narrowing of the vagina.  She did not need a vault suspension  I instilled 20 cc of a lidocaine epinephrine mixture underneath the vaginal epithelium.  I made a T-shaped anterior vaginal wall incision with my Allis technique.  I sharply mobilized the overlying vaginal epithelium from the pubocervical fascia to the white line bilaterally.  I mobilized appropriately at the apex.  I did an anterior repair with running 2-0 Vicryl on SH needle.  I did not imbricate the bladder neck.  The patient was cystoscoped afterwards.  She had cystoscopically a very good repair.  Ureteral orifices were normal and not retracted inward.  She had excellent efflux bilaterally of dye  I trimmed an appropriate amount of anterior vaginal epithelium and closed anterior vaginal wall with running 2-0 Vicryl on a CT1 needle.  She had very good length and there was no narrowing  I then performed a sling.  I made 2 less than 1 cm incisions 1 fingerbreadth above the symphysis pubis 1.5 cm lateral to the midline.  She had a lot of epithelial folds and shortening of the urethra and this was taken into consideration when I marked a submucosal incision.  2-3 cc of lidocaine epinephrine mixture was utilized.  I made an appropriate depth incision.  I mobilized laterally with sharp dissection and spreading dissection to the urethrovesical angle bilaterally  With the bladder  emptied I passed a trocar on top of along the back the symphysis pubis onto the pulp of my index finger bilaterally.  I cystoscoped the patient.  She had no injury to bladder.  No trocar in the bladder.  This was triple checked.  There was no injury to the urethra.  There was efflux clear urine bilaterally.  With the bladder emptied I attached the mesh sling and brought it up through the retropubic space and tensioned it over the fat part of a moderate size Kelly clamp.  There was appropriate hypermobility with no spring back.  There is no question it was loose relative to her shortness of urethra anatomy.  I was very pleased with the tension and position of the mesh.  I closed anterior vaginal wall with running 2-0 Vicryl on a CT1 needle followed by two interrupted sutures.   Mesh was cut in the abdominal incisions and each incision was closed with interrupted 4-0 Vicryl.  Dermabond was applied.  Vaginal pack with clindamycin cream applied  I was very pleased with the surgery.  Blood loss was less than 100 mL.  Hopefully the operation will reach the patient's treatment goal

## 2018-07-21 NOTE — Anesthesia Preprocedure Evaluation (Signed)
Anesthesia Evaluation  Patient identified by MRN, date of birth, ID band Patient awake    Reviewed: Allergy & Precautions, NPO status , Patient's Chart, lab work & pertinent test results  Airway Mallampati: II  TM Distance: >3 FB Neck ROM: Full    Dental  (+) Teeth Intact, Dental Advisory Given   Pulmonary    breath sounds clear to auscultation       Cardiovascular hypertension,  Rhythm:Regular Rate:Normal     Neuro/Psych    GI/Hepatic   Endo/Other    Renal/GU      Musculoskeletal   Abdominal   Peds  Hematology   Anesthesia Other Findings   Reproductive/Obstetrics                             Anesthesia Physical Anesthesia Plan  ASA: II  Anesthesia Plan: General   Post-op Pain Management:    Induction: Intravenous  PONV Risk Score and Plan: Ondansetron and Dexamethasone  Airway Management Planned: Oral ETT  Additional Equipment:   Intra-op Plan:   Post-operative Plan: Extubation in OR  Informed Consent: I have reviewed the patients History and Physical, chart, labs and discussed the procedure including the risks, benefits and alternatives for the proposed anesthesia with the patient or authorized representative who has indicated his/her understanding and acceptance.     Dental advisory given  Plan Discussed with: Anesthesiologist and CRNA  Anesthesia Plan Comments:         Anesthesia Quick Evaluation

## 2018-07-21 NOTE — Anesthesia Procedure Notes (Signed)
Procedure Name: Intubation Date/Time: 07/21/2018 7:41 AM Performed by: Mitzie Na, CRNA Pre-anesthesia Checklist: Patient identified, Emergency Drugs available, Suction available and Patient being monitored Patient Re-evaluated:Patient Re-evaluated prior to induction Oxygen Delivery Method: Circle system utilized Preoxygenation: Pre-oxygenation with 100% oxygen Induction Type: IV induction, Rapid sequence and Cricoid Pressure applied Laryngoscope Size: Mac and 3 Grade View: Grade I Tube type: Oral Tube size: 7.0 mm Number of attempts: 1 Airway Equipment and Method: Stylet and Oral airway Placement Confirmation: ETT inserted through vocal cords under direct vision,  positive ETCO2 and breath sounds checked- equal and bilateral Secured at: 22 cm Tube secured with: Tape Dental Injury: Teeth and Oropharynx as per pre-operative assessment

## 2018-07-21 NOTE — Interval H&P Note (Signed)
History and Physical Interval Note:  07/21/2018 7:09 AM  Heather Raymond  has presented today for surgery, with the diagnosis of CYSTOCELE VAULT PROLAPSE STRESS INCONTINENCE.  The various methods of treatment have been discussed with the patient and family. After consideration of risks, benefits and other options for treatment, the patient has consented to  Procedure(s): ANTERIOR REPAIR (CYSTOCELE) SLING CYSTOSCOPY ([name]) VAGINAL VAULT SUSPENSION GRAFT ([name]) as a surgical intervention.  The patient's history has been reviewed, patient examined, no change in status, stable for surgery.  I have reviewed the patient's chart and labs.  Questions were answered to the patient's satisfaction.     [name] A Avamae Dehaan

## 2018-07-21 NOTE — Transfer of Care (Signed)
Immediate Anesthesia Transfer of Care Note  Patient: Heather Raymond  Procedure(s) Performed: ANTERIOR REPAIR (CYSTOCELE) SLING CYSTOSCOPY ([name] )  Patient Location: PACU  Anesthesia Type:General  Level of Consciousness: awake, alert , oriented and patient cooperative  Airway & Oxygen Therapy: Patient Spontanous Breathing and Patient connected to face mask oxygen  Post-op Assessment: Report given to RN, Post -op Vital signs reviewed and stable and Patient moving all extremities  Post vital signs: Reviewed and stable  Last Vitals:  Vitals Value Taken Time  BP 153/95 07/21/18 1049  Temp    Pulse 137 07/21/18 1052  Resp 16 07/21/18 1052  SpO2 100 % 07/21/18 1052  Vitals shown include unvalidated device data.  Last Pain:  Vitals:   07/21/18 0628  TempSrc:   PainSc: 0-No pain         Complications: No apparent anesthesia complications

## 2018-07-21 NOTE — Anesthesia Postprocedure Evaluation (Signed)
Anesthesia Post Note  Patient: Heather Raymond  Procedure(s) Performed: ANTERIOR REPAIR (CYSTOCELE) SLING CYSTOSCOPY ([name] )     Patient location during evaluation: PACU Anesthesia Type: General Level of consciousness: awake and alert Pain management: pain level controlled Vital Signs Assessment: post-procedure vital signs reviewed and stable Respiratory status: spontaneous breathing, nonlabored ventilation, respiratory function stable and patient connected to nasal cannula oxygen Cardiovascular status: blood pressure returned to baseline and stable Postop Assessment: no apparent nausea or vomiting Anesthetic complications: no    Last Vitals:  Vitals:   07/21/18 1145 07/21/18 1200  BP: 130/86 (!) 135/95  Pulse: 92 96  Resp: 16 19  Temp:  (!) 36.3 C  SpO2: 98% 98%    Last Pain:  Vitals:   07/21/18 1200  TempSrc:   PainSc: 0-No pain                 Kenta Laster COKER

## 2018-07-22 ENCOUNTER — Encounter (HOSPITAL_COMMUNITY): Payer: Self-pay | Admitting: Urology

## 2018-07-22 DIAGNOSIS — N8111 Cystocele, midline: Secondary | ICD-10-CM | POA: Diagnosis not present

## 2018-07-22 LAB — BASIC METABOLIC PANEL
Anion gap: 9 (ref 5–15)
BUN: 8 mg/dL (ref 8–23)
CO2: 25 mmol/L (ref 22–32)
Calcium: 9 mg/dL (ref 8.9–10.3)
Chloride: 108 mmol/L (ref 98–111)
Creatinine, Ser: 0.61 mg/dL (ref 0.44–1.00)
GFR calc Af Amer: >60 mL/min (ref >60)
GFR calc non Af Amer: >60 mL/min (ref >60)
Glucose, Bld: 127 mg/dL — ABNORMAL HIGH (ref 70–99)
Potassium: 3.5 mmol/L (ref 3.5–5.1)
Sodium: 142 mmol/L (ref 135–145)

## 2018-07-22 LAB — HEMOGLOBIN AND HEMATOCRIT, BLOOD
HCT: 38.2 % (ref 36.0–46.0)
Hemoglobin: 12.6 g/dL (ref 12.0–15.0)

## 2018-07-22 NOTE — Progress Notes (Signed)
Vitals normal No pain or nausea Void trial Detail post op instructions

## 2018-07-22 NOTE — Progress Notes (Signed)
Discharge instructions reviewed with patient. All questions answered. IV removed. Self cath instructions reviewed and patient demonstrated successfully.

## 2018-07-22 NOTE — Discharge Instructions (Signed)
I have reviewed discharge instructions in detail with the patient. They will follow-up with me or their physician as scheduled. My nurse will also be calling the patients as per protocol. As discussed with Dr. Lara Palinkas. ° °You may resume aspirin, advil, aleve, vitamins, and supplements 7 days after surgery. °

## 2018-07-22 NOTE — Discharge Summary (Signed)
Date of admission: 07/21/2018  Date of discharge: 07/22/2018  Admission diagnosis: cystocele and stress incontinence  Discharge diagnosis: cystocele and stress incontinence  Secondary diagnoses:cystocele and stress incontinence  History and Physical: For full details, please see admission history and physical. Briefly, Heather Raymond is a 63 y.o. year old patient with cystocele and stress incontinence  Hospital Course: cystocele repair and sling and cysto; excellent post op course  Laboratory values:  Recent Labs    07/21/18 1604 07/22/18 0511  HGB 13.4 12.6  HCT 40.8 38.2   Recent Labs    07/22/18 0511  CREATININE 0.61    Disposition: Home  Discharge instruction: The patient was instructed to be ambulatory but told to refrain from heavy lifting, strenuous activity, or driving. Detailed  Discharge medications:  Allergies as of 07/22/2018      Reactions   Other Anaphylaxis   CILANTRO   Sulfa Antibiotics Rash      Medication List    STOP taking these medications   CALCIUM + D PO   CRANBERRY PO   GLUCOSAMINE PO   ibuprofen 200 MG tablet Commonly known as: ADVIL   MULTIVITAMIN PO   vitamin C 500 MG tablet Commonly known as: ASCORBIC ACID     TAKE these medications   albuterol 108 (90 Base) MCG/ACT inhaler Commonly known as: ProAir HFA Inhale 4 puffs into the lungs every 4 (four) hours as needed for wheezing or shortness of breath. What changed: how much to take   budesonide-formoterol 80-4.5 MCG/ACT inhaler Commonly known as: Symbicort Inhale 1 puff into the lungs 2 (two) times daily. What changed:   how much to take  when to take this  reasons to take this   EQ Chlortabs 4 MG tablet Generic drug: chlorpheniramine Take 4 mg by mouth See admin instructions. Take 1 tablet at bedtime. Take 1 tablet once a day as needed for allergies.   fluticasone 50 MCG/ACT nasal spray Commonly known as: FLONASE Place 1 spray into both nostrils daily.    HYDROcodone-acetaminophen 5-325 MG tablet Commonly known as: Norco Take 1-2 tablets by mouth every 6 (six) hours as needed.   levothyroxine 75 MCG tablet Commonly known as: SYNTHROID Take 75 mcg by mouth daily before breakfast.   SOOTHE XP OP Place 1 drop into both eyes 2 (two) times daily as needed (for dry eyes).   telmisartan 80 MG tablet Commonly known as: MICARDIS Take 80 mg by mouth at bedtime.       Followup:  Follow-up Information    Macguire Holsinger, Nicki Reaper, MD.   Specialty: Urology Why: office will call you with date and time of appt. Contact information: [place] [phone]

## 2018-07-22 NOTE — Progress Notes (Signed)
Foley and vaginal packing removed with minimal discomfort. Patient due to void. Will continue to monitor.

## 2018-08-07 ENCOUNTER — Ambulatory Visit
Admission: EM | Admit: 2018-08-07 | Discharge: 2018-08-07 | Disposition: A | Discharge status: Home / Self Care | Payer: BC Managed Care – PPO | Attending: Emergency Medicine | Admitting: Emergency Medicine

## 2018-08-07 ENCOUNTER — Other Ambulatory Visit: Payer: Self-pay

## 2018-08-07 DIAGNOSIS — N3001 Acute cystitis with hematuria: Secondary | ICD-10-CM | POA: Insufficient documentation

## 2018-08-07 LAB — POCT URINALYSIS DIP (MANUAL ENTRY)
Bilirubin, UA: NEGATIVE
Glucose, UA: NEGATIVE mg/dL
Ketones, POC UA: NEGATIVE mg/dL
Nitrite, UA: NEGATIVE
Protein Ur, POC: NEGATIVE mg/dL
Spec Grav, UA: 1.01 (ref 1.010–1.025)
Urobilinogen, UA: 0.2 E.U./dL
pH, UA: 7 (ref 5.0–8.0)

## 2018-08-07 MED ORDER — PHENAZOPYRIDINE HCL 200 MG PO TABS: 200.0000 mg | ORAL_TABLET | Freq: Three times a day (TID) | ORAL | 0 refills | Status: DC

## 2018-08-07 MED ORDER — CIPROFLOXACIN HCL 500 MG PO TABS: 500.0000 mg | ORAL_TABLET | Freq: Two times a day (BID) | ORAL | 0 refills | Status: AC

## 2018-08-07 NOTE — ED Triage Notes (Signed)
Pt had cysto and vaginal sling on June 16th, has had one uti post procedure, was seen by [facility] urology  yesterday and urine was WNL. Pt experiencing painful urination this morning

## 2018-08-07 NOTE — ED Provider Notes (Signed)
[facility]   CC: "UTI"  SUBJECTIVE:  Heather Raymond is a 63 y.o. female hx significant for asthma, HTN, hypothyroid, PONV, and thyroid disease, who complains of urinary frequency, urgency and dysuria for the x 1 day.  Pt had a cysto and vaginal sling on 07/21/2018.  Was performing urinary catherization at home at that time.  Seen by [facility] yesterday, was asymptomatic, and urine was negative for infection.  Has NOT tried OTC medications.  Symptoms are made worse with urination.  Admits to similar symptoms in the past.  Denies fever, chills, nausea, vomiting, abdominal pain, flank pain, abnormal vaginal discharge or bleeding, hematuria.    LMP: No LMP recorded. Patient has had a hysterectomy.  ROS: As in HPI.  Past Medical History:  Diagnosis Date  . Asthma   . Hypertension   . Hypothyroidism   . PONV (postoperative nausea and vomiting)   . Thyroid disease    Past Surgical History:  Procedure Laterality Date  . ABDOMINAL HYSTERECTOMY    . APPENDECTOMY    . BUNIONECTOMY     March 2014  . COLONOSCOPY [name] 06/30/2012   Dr.Rourk- adequate prep, normal rectum, scattered left-sided divertiucula 55m polyp in the ascending segment o/w the remainder of the colon appeared normal. bx=benign lymphoid polyp  . COLONOSCOPY [name] 08/06/2017   Dr. RGala Romney diverticulosis. next tcs in 7 years.   .Lester CarolinaREPAIR [name] 07/21/2018   Procedure: ANTERIOR REPAIR (CYSTOCELE) SLING CYSTOSCOPY;  Surgeon: MBjorn Loser MD;  Location: [facility];  Service: Urology;  Laterality: [name];  . leocolonoscopy  04/29/2007   RXNA:TFTDDUKanal canal hemorrhoids/Left-sided diverticula/Flat polyp, mid sigmoid colon (hyperplastic), diminutive cecal polyp (tubular adenoma)   Allergies  Allergen Reactions  . Other Anaphylaxis    CILANTRO  . Sulfa Antibiotics Rash   No current facility-administered medications on file prior to encounter.    Current Outpatient Medications on File Prior to Encounter   Medication Sig Dispense Refill  . albuterol (PROAIR HFA) 108 (90 Base) MCG/ACT inhaler Inhale 4 puffs into the lungs every 4 (four) hours as needed for wheezing or shortness of breath. (Patient taking differently: Inhale 2 puffs into the lungs every 4 (four) hours as needed for wheezing or shortness of breath. ) 1 Inhaler 2  . Artificial Tear Solution (SOOTHE XP OP) Place 1 drop into both eyes 2 (two) times daily as needed (for dry eyes).    . budesonide-formoterol (SYMBICORT) 80-4.5 MCG/ACT inhaler Inhale 1 puff into the lungs 2 (two) times daily. (Patient taking differently: Inhale 2 puffs into the lungs 2 (two) times daily as needed (for shortness of breath or wheezing). ) 1 Inhaler 2  . cefUROXime (CEFTIN) 500 MG tablet Take 500 mg by mouth 2 (two) times daily.    . chlorpheniramine (EQ CHLORTABS) 4 MG tablet Take 4 mg by mouth See admin instructions. Take 1 tablet at bedtime. Take 1 tablet once a day as needed for allergies.    . fluticasone (FLONASE) 50 MCG/ACT nasal spray Place 1 spray into both nostrils daily.     .Marland KitchenHYDROcodone-acetaminophen (NORCO) 5-325 MG tablet Take 1-2 tablets by mouth every 6 (six) hours as needed. 30 tablet 0  . levothyroxine (SYNTHROID, LEVOTHROID) 75 MCG tablet Take 75 mcg by mouth daily before breakfast.     . telmisartan (MICARDIS) 80 MG tablet Take 80 mg by mouth at bedtime.      Social History   Socioeconomic History  . Marital status: Married    Spouse name:  Not on file  . Number of children: Not on file  . Years of education: Not on file  . Highest education level: Not on file  Occupational History  . Occupation: Retired Advertising account planner: RETIRED  Social Needs  . Financial resource strain: Not on file  . Food insecurity    Worry: Not on file    Inability: Not on file  . Transportation needs    Medical: Not on file    Non-medical: Not on file  Tobacco Use  . Smoking status: Never Smoker  . Smokeless tobacco: Never Used  Substance and  Sexual Activity  . Alcohol use: No  . Drug use: No  . Sexual activity: Yes    Birth control/protection: Surgical    Comment: hyst  Lifestyle  . Physical activity    Days per week: Not on file    Minutes per session: Not on file  . Stress: Not on file  Relationships  . Social Herbalist on phone: Not on file    Gets together: Not on file    Attends religious service: Not on file    Active member of club or organization: Not on file    Attends meetings of clubs or organizations: Not on file    Relationship status: Not on file  . Intimate partner violence    Fear of current or ex partner: Not on file    Emotionally abused: Not on file    Physically abused: Not on file    Forced sexual activity: Not on file  Other Topics Concern  . Not on file  Social History Narrative   One child living. One son deceased secondary to accident.   Family History  Problem Relation Age of Onset  . Gastric cancer Other        Grandmother  . Multiple myeloma Father        Deceased at age 75  . Allergic rhinitis Mother   . Dementia Mother   . Mental illness Sister   . Emphysema Paternal Grandfather   . Heart disease Paternal Grandmother   . Cancer Maternal Grandmother        stomach  . Diabetes Maternal Grandfather   . Other Brother        broke neck in MVA  . Eczema Sister   . Hypertension Sister   . Suicidality Son   . High Cholesterol Daughter   . Colon cancer Neg Hx   . Angioedema Neg Hx   . Asthma Neg Hx   . Atopy Neg Hx   . Immunodeficiency Neg Hx   . Urticaria Neg Hx     OBJECTIVE:  Vitals:   08/07/18 1750  BP: (!) 142/95  Pulse: 85  Resp: 20  Temp: 98 F (36.7 C)  SpO2: 96%   General appearance: Alert in no acute distress HEENT: NCAT.  Oropharynx clear.  Lungs: clear to auscultation bilaterally without adventitious breath sounds Heart: regular rate and rhythm.   Abdomen: soft; non-distended; no tenderness; bowel sounds present; no guarding or rebound  tenderness Back: no CVA tenderness Extremities: no edema; symmetrical with no gross deformities Skin: warm and dry Neurologic: Ambulates from chair to exam table without difficulty Psychological: alert and cooperative; normal mood and affect  Labs Reviewed  POCT URINALYSIS DIP (MANUAL ENTRY) - Abnormal; Notable for the following components:      Result Value   Color, UA light yellow (*)    Blood, UA moderate (*)  Leukocytes, UA Large (3+) (*)    All other components within normal limits  URINE CULTURE    ASSESSMENT & PLAN:  1. Acute cystitis with hematuria     Meds ordered this encounter  Medications  . phenazopyridine (PYRIDIUM) 200 MG tablet    Sig: Take 1 tablet (200 mg total) by mouth 3 (three) times daily.    Dispense:  6 tablet    Refill:  0    Order Specific Question:   Supervising Provider    Answer:   Raylene Everts [[id number]]  . ciprofloxacin (CIPRO) 500 MG tablet    Sig: Take 1 tablet (500 mg total) by mouth 2 (two) times daily for 10 days.    Dispense:  20 tablet    Refill:  0    Order Specific Question:   Supervising Provider    Answer:   Raylene Everts [[id number]]   Urine did show signs of infection. Urine culture sent.  We will call you with abnormal results or if we need to change your antibiotic.   Push fluids and get plenty of rest.   Take antibiotic as directed and to completion Take pyridium as prescribed and as needed for symptomatic relief Follow up with PCP/ Urology if symptoms persists Return here or go to ER if you have any new or worsening symptoms such as fever, worsening abdominal pain, nausea/vomiting, flank pain, etc...  Outlined signs and symptoms indicating need for more acute intervention. Patient verbalized understanding. After Visit Summary given.     Lestine Box, PA-C 08/07/18 1807

## 2018-08-07 NOTE — Discharge Instructions (Addendum)
Urine did show signs of infection. Urine culture sent.  We will call you with abnormal results or if we need to change your antibiotic.   Push fluids and get plenty of rest.   Take antibiotic as directed and to completion Take pyridium as prescribed and as needed for symptomatic relief Follow up with PCP/ Urology if symptoms persists Return here or go to ER if you have any new or worsening symptoms such as fever, worsening abdominal pain, nausea/vomiting, flank pain, etc..Marland Kitchen

## 2018-08-12 LAB — URINE CULTURE: Culture: >=100000 — AB

## 2018-08-13 ENCOUNTER — Telehealth (HOSPITAL_COMMUNITY): Payer: Self-pay | Admitting: Emergency Medicine

## 2018-08-13 MED ORDER — NITROFURANTOIN MONOHYD MACRO 100 MG PO CAPS: 100.0000 mg | ORAL_CAPSULE | Freq: Two times a day (BID) | ORAL | 0 refills | Status: DC

## 2018-08-13 NOTE — Telephone Encounter (Signed)
Urine culture was positive for ecoli  resistant to cipro given at urgent care visit. Prescription for macrobid per Heather Halt NP sent to pharmacy of choice. Pt called and made aware. Pt educated to follow up if symptoms are not improving. Verbalized understanding.

## 2019-03-29 DIAGNOSIS — Z0000 Encounter for general adult medical examination without abnormal findings: Secondary | ICD-10-CM | POA: Diagnosis not present

## 2019-03-29 DIAGNOSIS — Z6825 Body mass index (BMI) 25.0-25.9, adult: Secondary | ICD-10-CM | POA: Diagnosis not present

## 2019-03-29 DIAGNOSIS — Z1389 Encounter for screening for other disorder: Secondary | ICD-10-CM | POA: Diagnosis not present

## 2019-03-29 DIAGNOSIS — I10 Essential (primary) hypertension: Secondary | ICD-10-CM | POA: Diagnosis not present

## 2019-03-29 DIAGNOSIS — E039 Hypothyroidism, unspecified: Secondary | ICD-10-CM | POA: Diagnosis not present

## 2019-03-29 DIAGNOSIS — E663 Overweight: Secondary | ICD-10-CM | POA: Diagnosis not present

## 2019-03-30 ENCOUNTER — Other Ambulatory Visit (HOSPITAL_COMMUNITY): Payer: Self-pay | Admitting: Family Medicine

## 2019-03-30 DIAGNOSIS — E2839 Other primary ovarian failure: Secondary | ICD-10-CM

## 2019-04-01 DIAGNOSIS — Z1231 Encounter for screening mammogram for malignant neoplasm of breast: Secondary | ICD-10-CM | POA: Diagnosis not present

## 2019-04-21 DIAGNOSIS — N3000 Acute cystitis without hematuria: Secondary | ICD-10-CM | POA: Diagnosis not present

## 2019-04-21 DIAGNOSIS — N3946 Mixed incontinence: Secondary | ICD-10-CM | POA: Diagnosis not present

## 2019-08-04 ENCOUNTER — Encounter: Payer: Self-pay | Admitting: Allergy & Immunology

## 2019-08-04 ENCOUNTER — Ambulatory Visit (INDEPENDENT_AMBULATORY_CARE_PROVIDER_SITE_OTHER): Payer: PPO | Admitting: Allergy & Immunology

## 2019-08-04 ENCOUNTER — Other Ambulatory Visit: Payer: Self-pay

## 2019-08-04 VITALS — BP 114/82 | HR 83 | Temp 98.5°F | Ht 62.0 in | Wt 154.4 lb

## 2019-08-04 DIAGNOSIS — J3089 Other allergic rhinitis: Secondary | ICD-10-CM | POA: Diagnosis not present

## 2019-08-04 DIAGNOSIS — J302 Other seasonal allergic rhinitis: Secondary | ICD-10-CM | POA: Diagnosis not present

## 2019-08-04 DIAGNOSIS — J4540 Moderate persistent asthma, uncomplicated: Secondary | ICD-10-CM | POA: Diagnosis not present

## 2019-08-04 DIAGNOSIS — T7800XD Anaphylactic reaction due to unspecified food, subsequent encounter: Secondary | ICD-10-CM

## 2019-08-04 NOTE — Progress Notes (Signed)
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  I have not seen her in over a year, but she is actually done fairly well from an asthma perspective as well as a food allergy perspective.  She is able to easily avoid cilantro and has had no accidental exposures.  From an asthma perspective, she is only using her Symbicort on a as needed basis which seems to be working well for her.  She has not needed any albuterol or steroids or ER visits since doing this.   However, her allergic rhinitis is not under good control.  She remains on all the medications but is still having breakthrough symptoms.  She is interested in discussing allergen immunotherapy today.  She will call her insurance company to check on the coverage and give [place] a call back when she makes a decision.  Plan/Recommendations:    1. Moderate persistent asthma, uncomplicated - Lung testing looks perfect today. - I think you have a good handle on your asthma.  - Daily controller medication(s): NOTHING - Rescue medications: ProAir 4 puffs every 4-6 hours as needed - Changes during respiratory infections or worsening symptoms: add on Symbicort 80/4.71mcg to 2 puffs twice daily for ONE TO TWO WEEKS. - Asthma control goals:  * Full participation in all desired activities (may need albuterol before activity) * Albuterol use two time or less a week on average (not counting use with activity) * Cough interfering with sleep two time or less a month * Oral steroids no more than once a year * No hospitalizations  2. Adverse food reaction (cilantro) - AuviQ is up to date. - Continue to avoid  cilantro.   3. Chronic nonseasonal allergic rhinitis (weeds, trees, grasses, molds, dust mite, cockroach, horse, and mouse) - Continue with Flonase 1-2 sprays per nostril daily. - Continue with nasal saline rinses every morning. - Continue with alternating Allegra and Zyrtec.  - Consider allergy shots as a means of long-term control. - Allergy shots "re-train" and "reset" the immune system to ignore environmental allergens and decrease the resulting immune response to those allergens (sneezing, itchy watery eyes, runny nose, nasal congestion, etc).    - Allergy shots improve symptoms in 75-85% of patients.  - CPT codes provided for allergy shots (call us after you talk to [employer] about the anticipated costs).   4. Return in about 3 months (around 11/04/2019). This can be an in-person, a virtual Webex or a telephone follow up visit.   Subjective:   Heather Raymond is a 64 y.o. female presenting today for follow up of No chief complaint on file.   Heather Raymond has a history of the following: Patient Active Problem List   Diagnosis Date Noted  . Cystocele with prolapse 07/21/2018  . Constipation 11/21/2017  . Well woman exam with routine gynecological exam 03/17/2017  . SUI (stress urinary incontinence, female) 02/17/2017  . Female cystocele 01/02/2017  . Post-menopausal atrophic vaginitis 01/02/2017  . Perennial allergic rhinitis 01/09/2016  . Adverse food reaction 01/09/2016  . Mild persistent asthma, uncomplicated 15/72/6203  . Abdominal pain, chronic, right upper quadrant 11/04/2013  . Hx of adenomatous colonic polyps 11/04/2013  . Encounter for colonoscopy due to  history of adenomatous colonic polyps 06/01/2012    History obtained from: chart review and patient.  Heather Raymond is a 64 y.o. female presenting for a follow up visit.  She was last seen in April 2019.  At that time, her lung testing looked excellent.  We continued her on Symbicort 80/4.5 mcg 1 puff once  daily with a spacer, increasing to 2 puffs twice daily during flares.  For her history of anaphylaxis to select throat, we made sure her epinephrine autoinjector was up-to-date.  For her allergic rhinitis, she was doing good with Flonase as well as nasal saline rinses and alternating antihistamines.  We recommended that she follow-up in 6 months.  She follows up now over 2 years later.  Since last visit, she is actually done fairly well.  She wants to discuss allergy shots today, as her allergic rhinitis has been under poor control.  Asthma/Respiratory Symptom History: She is not using Symbicort at all at this point. She continues to have her rescue inhaler as well. She has not needed steroid for her asthma at all.  Heather Raymond's asthma has been well controlled. She has not required rescue medication, experienced nocturnal awakenings due to lower respiratory symptoms, nor have activities of daily living been limited. She has required no Emergency Department or Urgent Care visits for her asthma. She has required zero courses of systemic steroids for asthma exacerbations since the last visit. ACT score today is 21, indicating excellent asthma symptom control.   Allergic Rhinitis Symptom History: She is allergic to several different allergens. She remains on the Flonase but alternates between cetrizine and fexofenadine. She does this daily. She has not had a sinus infection since she saw me. She does have some reactions with coughing with exposure to cleaning. She thinks that the scents in things bother. Winter is typically better for her. She has pine trees all around her. She has been using extra doses of her antihistamines to get through.   Food Allergy Symptom History: She continues to avoid cilantro. She has had no allergic reactions at all. She does need a new EpiPen. She has had no accidental exposures at all.   She has received both of the [place] vaccinations. She had no issues with it aside from a sore  arm. She did geel a little feverish and achey. This resolved within no time.   Otherwise, there have been no changes to her past medical history, surgical history, family history, or social history.    Review of Systems  Constitutional: Negative.  Negative for chills, fever, malaise/fatigue and weight loss.  HENT: Positive for congestion and sinus pain. Negative for ear discharge and ear pain.        Positive for postnasal drip.  Positive for throat clearing.  Eyes: Negative for pain, discharge and redness.  Respiratory: Negative for cough, sputum production, shortness of breath and wheezing.   Cardiovascular: Negative.  Negative for chest pain and palpitations.  Gastrointestinal: Negative for abdominal pain, constipation, diarrhea, heartburn, nausea and vomiting.  Skin: Negative.  Negative for itching and rash.  Neurological: Negative for dizziness and headaches.  Endo/Heme/Allergies: Positive for environmental allergies. Does not bruise/bleed easily.       Objective:   Blood pressure 114/82, pulse 83, temperature 98.5 F (36.9 C), height 5\' 2"  (1.575 m), weight 154 lb 6.4 oz (70 kg), SpO2 96 %. Body mass index is 28.24 kg/m.   Physical Exam:  Physical Exam Constitutional:      Appearance: She is well-developed.  Comments: Smiling and pleasant.  HENT:     Head: Normocephalic and atraumatic.     Right Ear: Tympanic membrane, ear canal and external ear normal.     Left Ear: Tympanic membrane, ear canal and external ear normal.     Nose: No nasal deformity, septal deviation, mucosal edema or rhinorrhea.     Right Turbinates: Enlarged, swollen and pale.     Left Turbinates: Enlarged, swollen and pale.     Right Sinus: No maxillary sinus tenderness or frontal sinus tenderness.     Left Sinus: No maxillary sinus tenderness or frontal sinus tenderness.     Mouth/Throat:     Mouth: Mucous membranes are not pale and not dry.     Pharynx: Uvula midline.  Eyes:     General:         Right eye: No discharge.        Left eye: No discharge.     Conjunctiva/sclera: Conjunctivae normal.     Right eye: Right conjunctiva is not injected. No chemosis.    Left eye: Left conjunctiva is not injected. No chemosis.    Pupils: Pupils are equal, round, and reactive to light.  Cardiovascular:     Rate and Rhythm: Normal rate and regular rhythm.     Heart sounds: Normal heart sounds.  Pulmonary:     Effort: Pulmonary effort is normal. No tachypnea, accessory muscle usage or respiratory distress.     Breath sounds: Normal breath sounds. No wheezing, rhonchi or rales.     Comments: Moving air well in all lung fields.  Chest:     Chest wall: No tenderness.  Lymphadenopathy:     Cervical: No cervical adenopathy.  Skin:    Coloration: Skin is not pale.     Findings: No abrasion, erythema, petechiae or rash. Rash is not papular, urticarial or vesicular.  Neurological:     Mental Status: She is alert.  Psychiatric:        Behavior: Behavior is cooperative.      Diagnostic studies:    Spirometry: results normal (FEV1: 2.14/92%, FVC: 2.58/85%, FEV1/FVC: 83%).    Spirometry consistent with normal pattern.   Allergy Studies: none         Salvatore Marvel, MD  [facility] [place] [facility] [place]

## 2019-08-04 NOTE — Patient Instructions (Addendum)
1. Moderate persistent asthma, uncomplicated - Lung testing looks perfect today. - I think you have a good handle on your asthma.  - Daily controller medication(s): NOTHING - Rescue medications: ProAir 4 puffs every 4-6 hours as needed - Changes during respiratory infections or worsening symptoms: add on Symbicort 80/4.38mcg to 2 puffs twice daily for ONE TO TWO WEEKS. - Asthma control goals:  * Full participation in all desired activities (may need albuterol before activity) * Albuterol use two time or less a week on average (not counting use with activity) * Cough interfering with sleep two time or less a month * Oral steroids no more than once a year * No hospitalizations  2. Adverse food reaction (cilantro) - AuviQ is up to date. - Continue to avoid cilantro.   3. Chronic nonseasonal allergic rhinitis (weeds, trees, grasses, molds, dust mite, cockroach, horse, and mouse) - Continue with Flonase 1-2 sprays per nostril daily. - Continue with nasal saline rinses every morning. - Continue with alternating Allegra and Zyrtec.  - Consider allergy shots as a means of long-term control. - Allergy shots "re-train" and "reset" the immune system to ignore environmental allergens and decrease the resulting immune response to those allergens (sneezing, itchy watery eyes, runny nose, nasal congestion, etc).    - Allergy shots improve symptoms in 75-85% of patients.  - CPT codes provided for allergy shots (call us after you talk to [employer] about the anticipated costs).   4. Return in about 3 months (around 11/04/2019). This can be an in-person, a virtual Webex or a telephone follow up visit.   Please inform us of any Emergency Department visits, hospitalizations, or changes in symptoms. Call us before going to the ED for breathing or allergy symptoms since we might be able to fit you in for a sick visit. Feel free to contact us anytime with any questions, problems, or concerns.  It  was a pleasure to see you again today!  Websites that have reliable patient information: 1. American Academy of Asthma, Allergy, and Immunology: www.aaaai.org 2. Food Allergy Research and Education (FARE): foodallergy.org 3. Mothers of Asthmatics: http://www.asthmacommunitynetwork.org 4. American College of Allergy, Asthma, and Immunology: www.acaai.org   COVID-19 Vaccine Information can be found at: ShippingScam.co.uk For questions related to vaccine distribution or appointments, please email [email or web address] or call [phone].     "Like" [place] on Facebook and Instagram for our latest updates!        Make sure you are registered to vote! If you have moved or changed any of your contact information, you will need to get this updated before voting!  In some cases, you MAY be able to register to vote online: [email or web address]    Allergy Shots   Allergies are the result of a chain reaction that starts in the immune system. Your immune system controls how your body defends itself. For instance, if you have an allergy to pollen, your immune system identifies pollen as an invader or allergen. Your immune system overreacts by producing antibodies called Immunoglobulin E (IgE). These antibodies travel to cells that release chemicals, causing an allergic reaction.  The concept behind allergy immunotherapy, whether it is received in the form of shots or tablets, is that the immune system can be desensitized to specific allergens that trigger allergy symptoms. Although it requires time and patience, the payback can be long-term relief.  How Do Allergy Shots Work?  Allergy shots work much like a vaccine. Your body responds to injected amounts of a particular allergen  given in increasing doses, eventually developing a resistance and tolerance to it. Allergy shots can lead to decreased, minimal or no  allergy symptoms.  There generally are two phases: build-up and maintenance. Build-up often ranges from three to six months and involves receiving injections with increasing amounts of the allergens. The shots are typically given once or twice a week, though more rapid build-up schedules are sometimes used.  The maintenance phase begins when the most effective dose is reached. This dose is different for each person, depending on how allergic you are and your response to the build-up injections. Once the maintenance dose is reached, there are longer periods between injections, typically two to four weeks.  Occasionally doctors give cortisone-type shots that can temporarily reduce allergy symptoms. These types of shots are different and should not be confused with allergy immunotherapy shots.  Who Can Be Treated with Allergy Shots?  Allergy shots may be a good treatment approach for people with allergic rhinitis (hay fever), allergic asthma, conjunctivitis (eye allergy) or stinging insect allergy.   Before deciding to begin allergy shots, you should consider:  . The length of allergy season and the severity of your symptoms . Whether medications and/or changes to your environment can control your symptoms . Your desire to avoid long-term medication use . Time: allergy immunotherapy requires a major time commitment . Cost: may vary depending on your insurance coverage  Allergy shots for children age 84 and older are effective and often well tolerated. They might prevent the onset of new allergen sensitivities or the progression to asthma.  Allergy shots are not started on patients who are pregnant but can be continued on patients who become pregnant while receiving them. In some patients with other medical conditions or who take certain common medications, allergy shots may be of risk. It is important to mention other medications you talk to your allergist.   When Will I Feel Better?  Some  may experience decreased allergy symptoms during the build-up phase. For others, it may take as long as 12 months on the maintenance dose. If there is no improvement after a year of maintenance, your allergist will discuss other treatment options with you.  If you aren't responding to allergy shots, it may be because there is not enough dose of the allergen in your vaccine or there are missing allergens that were not identified during your allergy testing. Other reasons could be that there are high levels of the allergen in your environment or major exposure to non-allergic triggers like tobacco smoke.  What Is the Length of Treatment?  Once the maintenance dose is reached, allergy shots are generally continued for three to five years. The decision to stop should be discussed with your allergist at that time. Some people may experience a permanent reduction of allergy symptoms. Others may relapse and a longer course of allergy shots can be considered.  What Are the Possible Reactions?  The two types of adverse reactions that can occur with allergy shots are local and systemic. Common local reactions include very mild redness and swelling at the injection site, which can happen immediately or several hours after. A systemic reaction, which is less common, affects the entire body or a particular body system. They are usually mild and typically respond quickly to medications. Signs include increased allergy symptoms such as sneezing, a stuffy nose or hives.  Rarely, a serious systemic reaction called anaphylaxis can develop. Symptoms include swelling in the throat, wheezing, a feeling of tightness in  the chest, nausea or dizziness. Most serious systemic reactions develop within 30 minutes of allergy shots. This is why it is strongly recommended you wait in your doctor's office for 30 minutes after your injections. Your allergist is trained to watch for reactions, and his or her staff is trained and equipped  with the proper medications to identify and treat them.  Who Should Administer Allergy Shots?  The preferred location for receiving shots is your prescribing allergist's office. Injections can sometimes be given at another facility where the physician and staff are trained to recognize and treat reactions, and have received instructions by your prescribing allergist.

## 2019-08-05 ENCOUNTER — Encounter: Payer: Self-pay | Admitting: Allergy & Immunology

## 2019-08-05 DIAGNOSIS — J4540 Moderate persistent asthma, uncomplicated: Secondary | ICD-10-CM | POA: Insufficient documentation

## 2019-08-05 DIAGNOSIS — T7800XA Anaphylactic reaction due to unspecified food, initial encounter: Secondary | ICD-10-CM | POA: Insufficient documentation

## 2019-09-09 NOTE — Progress Notes (Signed)
Referring Provider: Celene Squibb, MD Primary Care Physician:  Sharilyn Sites, MD Primary GI Physician: Dr. Gala Romney  Chief Complaint  Patient presents with  . Diarrhea    started monday after lunch and daily since  . Abdominal Pain    RUQ and occas lower abd, comes/goes  . Nausea    no vomiting    HPI:   Heather Raymond is a 64 y.o. female presenting today for abdominal pain, diarrhea, and nausea. Last seen in our office October 2019 for constipation and recurrent right upper quadrant fullness, discomfort.  Similar symptoms in the past.  Symptoms essentially resolved after adding a probiotic.  She was advised to continue probiotic at that point and monitor symptoms.  Last colonoscopy in 2019 with diverticulosis hemorrhoid.  Repeat in 7 years.  Monday after lunch she developed diarrhea, abdominal pain, and nausea without vomiting. She had celery and half a sandwich for lunch. 1 day prior, she had a meal at church. No recent seafood or undercooked meats. No sick contacts. No recent antibiotics other than Macrobid which she is on chronically for UTIs. She does drink well water that is filtered.  She was having about 3 BMs per day that were watery. No blood in the stool or black stool. Feels like there is pressure in the RUQ and pressure/pain in the lower abdomen. Comes and goes.all symptoms tended to start after eating and improved after she would have a bowel movement. Abdominal pain would linger for short time after a bowel movement but within resolved.    Monday she had chills, no fever. No lightheadedness or dizziness. Urine is pale yellow.   Today symptoms seem to be improving. This morning she had formed BM in pieces. No nausea this morning. No abdominal pain today. Noticed she was having heart burn last but none this morning. Has been flowing a bland diet. No dysaphia.   Prior to this, she was without any significant GI symptoms.   Reports similar symptoms about 1 year ago that  resolved on its own. She is concerned about her gallbladder.  Past Medical History:  Diagnosis Date  . Asthma   . Hypertension   . Hypothyroidism   . PONV (postoperative nausea and vomiting)   . Thyroid disease     Past Surgical History:  Procedure Laterality Date  . ABDOMINAL HYSTERECTOMY    . APPENDECTOMY    . BUNIONECTOMY     March 2014  . COLONOSCOPY [name] 06/30/2012   Dr.Rourk- adequate prep, normal rectum, scattered left-sided divertiucula 55m polyp in the ascending segment o/w the remainder of the colon appeared normal. bx=benign lymphoid polyp  . COLONOSCOPY [name] 08/06/2017   Dr. RGala Romney diverticulosis. next tcs in 7 years.   .Lester CarolinaREPAIR [name] 07/21/2018   Procedure: ANTERIOR REPAIR (CYSTOCELE) SLING CYSTOSCOPY;  Surgeon: MBjorn Loser MD;  Location: [facility];  Service: Urology;  Laterality: [name];  . leocolonoscopy  04/29/2007   RQJJ:HERDEYCanal canal hemorrhoids/Left-sided diverticula/Flat polyp, mid sigmoid colon (hyperplastic), diminutive cecal polyp (tubular adenoma)    Current Outpatient Medications  Medication Sig Dispense Refill  . albuterol (PROAIR HFA) 108 (90 Base) MCG/ACT inhaler Inhale 4 puffs into the lungs every 4 (four) hours as needed for wheezing or shortness of breath. (Patient taking differently: Inhale 2 puffs into the lungs every 4 (four) hours as needed for wheezing or shortness of breath. ) 1 Inhaler 2  . Artificial Tear Solution (SOOTHE XP OP) Place 1 drop into both eyes 2 (two)  times daily as needed (for dry eyes).    . Ascorbic Acid (VITAMIN C PO) Take by mouth daily.    . budesonide-formoterol (SYMBICORT) 80-4.5 MCG/ACT inhaler Inhale 1 puff into the lungs 2 (two) times daily. 1 Inhaler 2  . calcium-vitamin D (OSCAL WITH D) 500-200 MG-UNIT tablet Take 1 tablet by mouth daily.    . fexofenadine (ALLEGRA) 180 MG tablet Take 180 mg by mouth as needed for allergies or rhinitis.    . fluticasone (FLONASE) 50 MCG/ACT nasal spray Place 1 spray into both  nostrils daily.     Marland Kitchen glucosamine-chondroitin 500-400 MG tablet Take 1 tablet by mouth daily.    Marland Kitchen levothyroxine (SYNTHROID, LEVOTHROID) 75 MCG tablet Take 75 mcg by mouth daily before breakfast.     . Multiple Vitamin (MULTIVITAMIN) tablet Take 1 tablet by mouth daily.    . nitrofurantoin, macrocrystal-monohydrate, (MACROBID) 100 MG capsule Take 1 capsule (100 mg total) by mouth 2 (two) times daily. (Patient taking differently: Take 100 mg by mouth daily. ) 10 capsule 0  . Probiotic Product (PROBIOTIC DAILY PO) Take by mouth daily.    Marland Kitchen telmisartan (MICARDIS) 80 MG tablet Take 80 mg by mouth at bedtime.      No current facility-administered medications for this visit.    Allergies as of 09/10/2019 - Review Complete 09/10/2019  Allergen Reaction Noted  . Other Anaphylaxis 12/03/2016  . Sulfa antibiotics Rash 12/17/2011    Family History  Problem Relation Age of Onset  . Gastric cancer Other        Grandmother  . Multiple myeloma Father        Deceased at age 56  . Allergic rhinitis Mother   . Dementia Mother   . Mental illness Sister   . Emphysema Paternal Grandfather   . Heart disease Paternal Grandmother   . Cancer Maternal Grandmother        stomach  . Diabetes Maternal Grandfather   . Other Brother        broke neck in MVA  . Eczema Sister   . Hypertension Sister   . Suicidality Son   . High Cholesterol Daughter   . Colon cancer Neg Hx   . Angioedema Neg Hx   . Asthma Neg Hx   . Atopy Neg Hx   . Immunodeficiency Neg Hx   . Urticaria Neg Hx     Social History   Socioeconomic History  . Marital status: Married    Spouse name: Not on file  . Number of children: Not on file  . Years of education: Not on file  . Highest education level: Not on file  Occupational History  . Occupation: Retired Advertising account planner: RETIRED  Tobacco Use  . Smoking status: Never Smoker  . Smokeless tobacco: Never Used  Vaping Use  . Vaping Use: Never used  Substance and  Sexual Activity  . Alcohol use: No  . Drug use: No  . Sexual activity: Yes    Birth control/protection: Surgical    Comment: hyst  Other Topics Concern  . Not on file  Social History Narrative   One child living. One son deceased secondary to accident.   Social Determinants of Health   Financial Resource Strain:   . Difficulty of Paying Living Expenses:   Food Insecurity:   . Worried About Charity fundraiser in the Last Year:   . Arboriculturist in the Last Year:   Transportation Needs:   .  Lack of Transportation (Medical):   Marland Kitchen Lack of Transportation (Non-Medical):   Physical Activity:   . Days of Exercise per Week:   . Minutes of Exercise per Session:   Stress:   . Feeling of Stress :   Social Connections:   . Frequency of Communication with Friends and Family:   . Frequency of Social Gatherings with Friends and Family:   . Attends Religious Services:   . Active Member of Clubs or Organizations:   . Attends Archivist Meetings:   Marland Kitchen Marital Status:     Review of Systems: Gen: See HPI CV: Denies chest pain or palpitations. Resp: Denies dyspnea or cough. GI: See HPI  Heme: See HPI  Physical Exam: BP (!) 146/87   Pulse 80   Temp (!) 97 F (36.1 C)   Ht _0  (1.575 m)   Wt 155 lb 9.6 oz (70.6 kg)   BMI 28.46 kg/m  General:   Alert and oriented. No distress noted. Pleasant and cooperative.  Head:  Normocephalic and atraumatic. Eyes:  Conjuctiva clear without scleral icterus. Heart:  S1, S2 present without murmurs appreciated. Lungs:  Clear to auscultation bilaterally. No wheezes, rales, or rhonchi. No distress.  Abdomen:  +BS, soft, non-tender and non-distended. No rebound or guarding. No HSM or masses noted. Msk:  Symmetrical without gross deformities. Normal posture. Extremities:  Without edema. Neurologic:  Alert and  oriented x4 Psych: Normal mood and affect.

## 2019-09-10 ENCOUNTER — Other Ambulatory Visit: Payer: Self-pay

## 2019-09-10 ENCOUNTER — Encounter: Payer: Self-pay | Admitting: Gastroenterology

## 2019-09-10 ENCOUNTER — Ambulatory Visit (INDEPENDENT_AMBULATORY_CARE_PROVIDER_SITE_OTHER): Payer: PPO | Admitting: Gastroenterology

## 2019-09-10 DIAGNOSIS — R109 Unspecified abdominal pain: Secondary | ICD-10-CM | POA: Diagnosis not present

## 2019-09-10 DIAGNOSIS — R110 Nausea: Secondary | ICD-10-CM

## 2019-09-10 DIAGNOSIS — R197 Diarrhea, unspecified: Secondary | ICD-10-CM

## 2019-09-10 NOTE — Assessment & Plan Note (Signed)
Addressed under diarrhea.

## 2019-09-10 NOTE — Patient Instructions (Signed)
I suspect you likely had a vital gastroenteritis as the cause of your abdominal pain, nausea, and diarrhea.   As you are improving, I recommend you continue following a bland diet and advance as tolerated.  Be sure to drink enough fluids to keep your urine pale yellow to clear.  Continue probiotic daily.   We will plan to follow-up with you in 2-3 month to ensure you are continuing to do well. If you have return of symptoms, please let me know.   Aliene Altes, PA-C [facility]

## 2019-09-10 NOTE — Assessment & Plan Note (Addendum)
64 year old female with new onset nausea, diarrhea, and abdominal pain/pressure located in the RUQ and lower abdomen that started after eating lunch on Monday (4 days ago). She had a sandwich and celery for lunch. The day prior, she had a meal at church. Denies eating any seafood, undercooked meats, sick contacts, fever, or chills. She is on Macrobid chronically. Drinks feels very well water. Symptoms tended to occur after meals with about 3 watery BMs daily. Abdominal pain would linger after BMs but tended to resolve until her next bowel movement. Symptoms are improving and today she is feeling better with no abdominal pain or nausea. Stool was formed this morning. Denies BRBPR or melena. Abdominal exam is benign.  Most suspicious for an acute gastroenteritis. Colonoscopy up-to-date in 2019. Patient is concerned about her gallbladder. I am less suspicious for this as her abdominal exam is completely benign. Advised that she continue to monitor her symptoms, advance diet as tolerated, drink enough fluids to keep urine pale yellow to clear, continue daily probiotic, and let me know if symptoms return. We will follow up with her in 2-3 months. If she continues with RUQ abdominal pain, will pursue ultrasound.

## 2019-11-22 NOTE — Progress Notes (Signed)
Referring Provider: Sharilyn Sites, MD Primary Care Physician:  Sharilyn Sites, MD Primary GI Physician: Dr. Gala Romney  Chief Complaint  Patient presents with  . Diarrhea    comes/goes few times per week, then stools will be normal  . Nausea    comes/goes with the diarrhea  . Abdominal Pain    "cramping" with the diarrhea    HPI:   Heather Raymond is a 64 y.o. female presenting today for follow-up of abdominal pain, diarrhea, and nausea without vomiting.  Prior history of few years back of RUQ fullness/discomfort that had improved/resolved with daily probiotic.  Also colonoscopy in 2019 with diverticulosis and 4 mm polyp (benign lymphoid polyp) with recommendations to repeat in 7 years.  At her last visit in August 2021, she reported new onset of diarrhea with 3 watery BMs daily, abdominal pain in RUQ and lower abdomen after eating and improved with BMs, and nausea without vomiting which began after eating lunch 4 days prior to office visit.  For lunch, she had celery and half a sandwich.  1 day prior to that, she had a meal at church.  No consumption of seafood or undercooked meats.  No sick contacts.  No recent antibiotics other than Macrobid which she is on chronically for UTIs.  Drinks well water that is filtered.  At the time of her office visit, symptoms were starting to improve.  Stools were forming up.  No nausea or abdominal pain day of office visit.  Suspect that she likely had an acute gastroenteritis.  She was concerned about her gallbladder but symptoms were not consistent with this and her abdominal exam was benign.  Recommended she monitor her symptoms, advance diet as tolerated, drink plenty of fluids, continue daily probiotic, let us know if symptoms worsen.  Follow-up in 2-3 months.  Consider RUQ ultrasound if RUQ pain persist.    Today: Still taking daily probiotic.   Loose stools about 1 day a week.  No watery BMs.on the day she has loose stools, she will have about 3-4 BMs.   The symptoms seem to be triggered by fatty meals or dairy products as she developed symptoms after eating a blooming onion and then again after having ice cream.  Associated abdominal cramping prior to BMs that resolved thereafter. No blood in the stool. No black stools. Remembers having some issues with drinking milk in the past.  Occasional nausea after eating about 1-3 times a week. No specific food triggers. No morning nausea. May last for about 1 hour. No vomiting.  Nausea started about 1 month ago. Mild heart burn about once a week. No associated abdominal pain. Has never been on anything for for acid reflux. Drinks gingerale and symptoms resolve. No dysphagia.   No NSAIDs.   Past Medical History:  Diagnosis Date  . Asthma   . Hypertension   . Hypothyroidism   . PONV (postoperative nausea and vomiting)   . Thyroid disease     Past Surgical History:  Procedure Laterality Date  . ABDOMINAL HYSTERECTOMY    . APPENDECTOMY    . BUNIONECTOMY     March 2014  . COLONOSCOPY [name] 06/30/2012   Dr.Rourk- adequate prep, normal rectum, scattered left-sided divertiucula 47m polyp in the ascending segment o/w the remainder of the colon appeared normal. bx=benign lymphoid polyp  . COLONOSCOPY [name] 08/06/2017   Dr. RGala Romney diverticulosis. next tcs in 7 years.   .Lester CarolinaREPAIR [name] 07/21/2018   Procedure: ANTERIOR REPAIR (CYSTOCELE) SLING CYSTOSCOPY;  Surgeon: Bjorn Loser, MD;  Location: [facility];  Service: Urology;  Laterality: [name];  . leocolonoscopy  04/29/2007   [id number] anal canal hemorrhoids/Left-sided diverticula/Flat polyp, mid sigmoid colon (hyperplastic), diminutive cecal polyp (tubular adenoma)    Current Outpatient Medications  Medication Sig Dispense Refill  . albuterol (PROAIR HFA) 108 (90 Base) MCG/ACT inhaler Inhale 4 puffs into the lungs every 4 (four) hours as needed for wheezing or shortness of breath. (Patient taking differently: Inhale 2 puffs into the lungs every 4 (four)  hours as needed for wheezing or shortness of breath. ) 1 Inhaler 2  . Artificial Tear Solution (SOOTHE XP OP) Place 1 drop into both eyes 2 (two) times daily as needed (for dry eyes).    . Ascorbic Acid (VITAMIN C PO) Take by mouth daily.    . budesonide-formoterol (SYMBICORT) 80-4.5 MCG/ACT inhaler Inhale 1 puff into the lungs 2 (two) times daily. 1 Inhaler 2  . calcium-vitamin D (OSCAL WITH D) 500-200 MG-UNIT tablet Take 1 tablet by mouth daily.    . fexofenadine (ALLEGRA) 180 MG tablet Take 180 mg by mouth as needed for allergies or rhinitis.    . fluticasone (FLONASE) 50 MCG/ACT nasal spray Place 1 spray into both nostrils daily.     Marland Kitchen glucosamine-chondroitin 500-400 MG tablet Take 1 tablet by mouth daily.    Marland Kitchen levothyroxine (SYNTHROID, LEVOTHROID) 75 MCG tablet Take 75 mcg by mouth daily before breakfast.     . Multiple Vitamin (MULTIVITAMIN) tablet Take 1 tablet by mouth daily.    . nitrofurantoin, macrocrystal-monohydrate, (MACROBID) 100 MG capsule Take 1 capsule (100 mg total) by mouth 2 (two) times daily. (Patient taking differently: Take 100 mg by mouth daily. ) 10 capsule 0  . Probiotic Product (PROBIOTIC DAILY PO) Take by mouth daily.    Marland Kitchen telmisartan (MICARDIS) 80 MG tablet Take 80 mg by mouth at bedtime.     Marland Kitchen omeprazole (PRILOSEC) 20 MG capsule Take 1 capsule (20 mg total) by mouth daily. 30 capsule 5   No current facility-administered medications for this visit.    Allergies as of 11/24/2019 - Review Complete 11/24/2019  Allergen Reaction Noted  . Other Anaphylaxis 12/03/2016  . Sulfa antibiotics Rash 12/17/2011    Family History  Problem Relation Age of Onset  . Gastric cancer Other        Grandmother  . Multiple myeloma Father        Deceased at age 27  . Allergic rhinitis Mother   . Dementia Mother   . Mental illness Sister   . Emphysema Paternal Grandfather   . Heart disease Paternal Grandmother   . Cancer Maternal Grandmother        stomach  . Diabetes  Maternal Grandfather   . Other Brother        broke neck in MVA  . Eczema Sister   . Hypertension Sister   . Suicidality Son   . High Cholesterol Daughter   . Colon cancer Neg Hx   . Angioedema Neg Hx   . Asthma Neg Hx   . Atopy Neg Hx   . Immunodeficiency Neg Hx   . Urticaria Neg Hx     Social History   Socioeconomic History  . Marital status: Married    Spouse name: Not on file  . Number of children: Not on file  . Years of education: Not on file  . Highest education level: Not on file  Occupational History  . Occupation: Retired Health visitor  Employer: RETIRED  Tobacco Use  . Smoking status: Never Smoker  . Smokeless tobacco: Never Used  Vaping Use  . Vaping Use: Never used  Substance and Sexual Activity  . Alcohol use: No  . Drug use: No  . Sexual activity: Yes    Birth control/protection: Surgical    Comment: hyst  Other Topics Concern  . Not on file  Social History Narrative   One child living. One son deceased secondary to accident.   Social Determinants of Health   Financial Resource Strain:   . Difficulty of Paying Living Expenses: Not on file  Food Insecurity:   . Worried About Charity fundraiser in the Last Year: Not on file  . Ran Out of Food in the Last Year: Not on file  Transportation Needs:   . Lack of Transportation (Medical): Not on file  . Lack of Transportation (Non-Medical): Not on file  Physical Activity:   . Days of Exercise per Week: Not on file  . Minutes of Exercise per Session: Not on file  Stress:   . Feeling of Stress : Not on file  Social Connections:   . Frequency of Communication with Friends and Family: Not on file  . Frequency of Social Gatherings with Friends and Family: Not on file  . Attends Religious Services: Not on file  . Active Member of Clubs or Organizations: Not on file  . Attends Archivist Meetings: Not on file  . Marital Status: Not on file    Review of Systems: Gen: Denies fever, chills,  cold or flulike symptoms, lightheadedness, dizziness, presyncope, syncope CV: Denies chest pain or palpitations Resp: Denies dyspnea or cough GI: See HPI Heme: See HPI  Physical Exam: BP (!) 138/91   Pulse 79   Temp (!) 97 F (36.1 C)   Ht 5' 2"  (1.575 m)   Wt 156 lb 3.2 oz (70.9 kg)   BMI 28.57 kg/m  General:   Alert and oriented. No distress noted. Pleasant and cooperative.  Head:  Normocephalic and atraumatic. Eyes:  Conjuctiva clear without scleral icterus. Heart:  S1, S2 present without murmurs appreciated. Lungs:  Clear to auscultation bilaterally. No wheezes, rales, or rhonchi. No distress.  Abdomen:  +BS, soft, non-tender and non-distended. No rebound or guarding. No HSM or masses noted. Msk:  Symmetrical without gross deformities. Normal posture. Extremities:  Without edema. Neurologic:  Alert and  oriented x4 Psych: Normal mood and affect.

## 2019-11-24 ENCOUNTER — Ambulatory Visit (INDEPENDENT_AMBULATORY_CARE_PROVIDER_SITE_OTHER): Payer: PPO | Admitting: Gastroenterology

## 2019-11-24 ENCOUNTER — Other Ambulatory Visit: Payer: Self-pay

## 2019-11-24 ENCOUNTER — Encounter: Payer: Self-pay | Admitting: Gastroenterology

## 2019-11-24 VITALS — BP 138/91 | HR 79 | Temp 97.0°F | Ht 62.0 in | Wt 156.2 lb

## 2019-11-24 DIAGNOSIS — R12 Heartburn: Secondary | ICD-10-CM | POA: Insufficient documentation

## 2019-11-24 DIAGNOSIS — R195 Other fecal abnormalities: Secondary | ICD-10-CM | POA: Diagnosis not present

## 2019-11-24 DIAGNOSIS — R110 Nausea: Secondary | ICD-10-CM | POA: Diagnosis not present

## 2019-11-24 MED ORDER — OMEPRAZOLE 20 MG PO CPDR: 20.0000 mg | DELAYED_RELEASE_CAPSULE | Freq: Every day | ORAL | 5 refills | Status: DC

## 2019-11-24 NOTE — Patient Instructions (Signed)
Please start omeprazole 20 mg daily. This needs to be separated from levothyroxine by 3-4 hours.   For heartburn:  Avoid fried, fatty, greasy, spicy, citrus foods. Avoid caffeine and carbonated beverages. Avoid chocolate.  Do not eat within 3 hours of laying down.  For diarrhea:  Continue daily probiotic.  Follow a low fat diet. All meats should be lean (pountry/fish).  Avoid dairy products or try lactaid prior to dairy consumption.   We will plan to see you back in 4 months. Do not hesitate to call with any questions or concerns prior to your next visit.   It was great seeing you today!  Aliene Altes, PA-C [facility]

## 2019-11-24 NOTE — Assessment & Plan Note (Signed)
Addressed under nausea without vomiting. 

## 2019-11-24 NOTE — Assessment & Plan Note (Addendum)
Intermittent postprandial nausea without vomiting about 1-3 times a week x1 month with no identified triggers. Resolves with ginger ale. Also with intermittent heart burn about once a week. No associated abdominal pain.  No NSAIDs. Abdominal exam is benign.  Query whether nausea may be secondary to atypical GERD symptoms. Not consistent with biliary etiology.   Plan:  Trial omeprazole 20 mg daily 30 minutes before breakfast.  Avoid fried, fatty, greasy, spicy, citrus foods. Avoid caffeine and carbonated beverages. Avoid chocolate.  Do not eat within 3 hours of laying down. Follow-up in 4 months. Advised to call with questions or concerns prior.

## 2019-11-24 NOTE — Assessment & Plan Note (Addendum)
Patient had experienced acute onset diarrhea with abdominal pain and nausea without vomiting in August. At her office visit in August, suspected she likely had viral gastroenteritis. She has been on a daily probiotic and symptoms are much improved. Currently with loose stools about 1 day/week with associated abdominal cramping prior to BMs that resolve thereafter. Notes symptoms seem to be triggered by fatty meals and dairy products. No BRBPR, melena, or unintentional weight loss. Colonoscopy up-to-date in 2019, due for repeat in 2026.  Suspect patient may have developed postinfectious IBS. As symptoms are fairly minimal, I do not feel we need to start any medications such as Bentyl or Levsin at this point. I have advised she continue her daily probiotic, follow a low-fat diet, avoid dairy products or take Lactaid tablets prior to dairy consumption, and monitor symptoms. We will plan to follow-up with her in 4 months. She was advised to let me know if she has any worsening symptoms.

## 2019-11-29 DIAGNOSIS — Z23 Encounter for immunization: Secondary | ICD-10-CM | POA: Diagnosis not present

## 2020-01-13 DIAGNOSIS — J019 Acute sinusitis, unspecified: Secondary | ICD-10-CM | POA: Diagnosis not present

## 2020-03-10 DIAGNOSIS — M5413 Radiculopathy, cervicothoracic region: Secondary | ICD-10-CM | POA: Diagnosis not present

## 2020-03-10 DIAGNOSIS — M25512 Pain in left shoulder: Secondary | ICD-10-CM | POA: Diagnosis not present

## 2020-03-10 DIAGNOSIS — M9902 Segmental and somatic dysfunction of thoracic region: Secondary | ICD-10-CM | POA: Diagnosis not present

## 2020-03-10 DIAGNOSIS — M9901 Segmental and somatic dysfunction of cervical region: Secondary | ICD-10-CM | POA: Diagnosis not present

## 2020-03-13 DIAGNOSIS — M25512 Pain in left shoulder: Secondary | ICD-10-CM | POA: Diagnosis not present

## 2020-03-13 DIAGNOSIS — M9901 Segmental and somatic dysfunction of cervical region: Secondary | ICD-10-CM | POA: Diagnosis not present

## 2020-03-13 DIAGNOSIS — M5413 Radiculopathy, cervicothoracic region: Secondary | ICD-10-CM | POA: Diagnosis not present

## 2020-03-13 DIAGNOSIS — M9902 Segmental and somatic dysfunction of thoracic region: Secondary | ICD-10-CM | POA: Diagnosis not present

## 2020-03-27 ENCOUNTER — Ambulatory Visit: Payer: PPO | Admitting: Gastroenterology

## 2020-03-28 DIAGNOSIS — J069 Acute upper respiratory infection, unspecified: Secondary | ICD-10-CM | POA: Diagnosis not present

## 2020-04-06 DIAGNOSIS — Z1231 Encounter for screening mammogram for malignant neoplasm of breast: Secondary | ICD-10-CM | POA: Diagnosis not present

## 2020-04-13 DIAGNOSIS — N3000 Acute cystitis without hematuria: Secondary | ICD-10-CM | POA: Diagnosis not present

## 2020-04-13 DIAGNOSIS — N3946 Mixed incontinence: Secondary | ICD-10-CM | POA: Diagnosis not present

## 2020-05-08 DIAGNOSIS — R197 Diarrhea, unspecified: Secondary | ICD-10-CM | POA: Diagnosis not present

## 2020-05-08 DIAGNOSIS — E063 Autoimmune thyroiditis: Secondary | ICD-10-CM | POA: Diagnosis not present

## 2020-05-08 DIAGNOSIS — Z681 Body mass index (BMI) 19 or less, adult: Secondary | ICD-10-CM | POA: Diagnosis not present

## 2020-05-08 DIAGNOSIS — R509 Fever, unspecified: Secondary | ICD-10-CM | POA: Diagnosis not present

## 2020-05-11 DIAGNOSIS — I10 Essential (primary) hypertension: Secondary | ICD-10-CM | POA: Diagnosis not present

## 2020-05-11 DIAGNOSIS — K7689 Other specified diseases of liver: Secondary | ICD-10-CM | POA: Diagnosis not present

## 2020-05-11 DIAGNOSIS — Z13220 Encounter for screening for lipoid disorders: Secondary | ICD-10-CM | POA: Diagnosis not present

## 2020-05-11 DIAGNOSIS — R197 Diarrhea, unspecified: Secondary | ICD-10-CM | POA: Diagnosis not present

## 2020-05-11 DIAGNOSIS — Z6825 Body mass index (BMI) 25.0-25.9, adult: Secondary | ICD-10-CM | POA: Diagnosis not present

## 2020-05-11 DIAGNOSIS — R1011 Right upper quadrant pain: Secondary | ICD-10-CM | POA: Diagnosis not present

## 2020-05-12 DIAGNOSIS — Z7689 Persons encountering health services in other specified circumstances: Secondary | ICD-10-CM | POA: Diagnosis not present

## 2020-05-16 ENCOUNTER — Other Ambulatory Visit: Payer: Self-pay | Admitting: Internal Medicine

## 2020-05-16 ENCOUNTER — Other Ambulatory Visit (HOSPITAL_COMMUNITY): Payer: Self-pay | Admitting: Internal Medicine

## 2020-05-16 DIAGNOSIS — R109 Unspecified abdominal pain: Secondary | ICD-10-CM

## 2020-05-26 ENCOUNTER — Other Ambulatory Visit: Payer: Self-pay

## 2020-05-26 ENCOUNTER — Ambulatory Visit (HOSPITAL_COMMUNITY)
Admission: RE | Admit: 2020-05-26 | Discharge: 2020-05-26 | Disposition: A | Discharge status: Home / Self Care | Payer: PPO | Source: Ambulatory Visit | Attending: Internal Medicine | Admitting: Internal Medicine

## 2020-05-26 DIAGNOSIS — R109 Unspecified abdominal pain: Secondary | ICD-10-CM | POA: Diagnosis not present

## 2020-05-26 DIAGNOSIS — N1330 Unspecified hydronephrosis: Secondary | ICD-10-CM | POA: Diagnosis not present

## 2020-06-15 ENCOUNTER — Encounter (HOSPITAL_COMMUNITY): Payer: Self-pay | Admitting: Hematology and Oncology

## 2020-06-16 ENCOUNTER — Inpatient Hospital Stay (HOSPITAL_COMMUNITY): Payer: PPO

## 2020-06-16 ENCOUNTER — Other Ambulatory Visit: Payer: Self-pay

## 2020-06-16 ENCOUNTER — Other Ambulatory Visit (HOSPITAL_COMMUNITY): Payer: Self-pay | Admitting: *Deleted

## 2020-06-16 ENCOUNTER — Inpatient Hospital Stay (HOSPITAL_COMMUNITY): Payer: PPO | Attending: Hematology and Oncology | Admitting: Hematology and Oncology

## 2020-06-16 VITALS — BP 148/90 | HR 95 | Temp 97.2°F | Resp 18 | Ht 62.0 in | Wt 150.3 lb

## 2020-06-16 DIAGNOSIS — E039 Hypothyroidism, unspecified: Secondary | ICD-10-CM | POA: Diagnosis not present

## 2020-06-16 DIAGNOSIS — I10 Essential (primary) hypertension: Secondary | ICD-10-CM | POA: Insufficient documentation

## 2020-06-16 DIAGNOSIS — E079 Disorder of thyroid, unspecified: Secondary | ICD-10-CM | POA: Diagnosis not present

## 2020-06-16 DIAGNOSIS — R1011 Right upper quadrant pain: Secondary | ICD-10-CM | POA: Diagnosis not present

## 2020-06-16 DIAGNOSIS — R161 Splenomegaly, not elsewhere classified: Secondary | ICD-10-CM | POA: Diagnosis not present

## 2020-06-16 DIAGNOSIS — R197 Diarrhea, unspecified: Secondary | ICD-10-CM | POA: Insufficient documentation

## 2020-06-16 DIAGNOSIS — N390 Urinary tract infection, site not specified: Secondary | ICD-10-CM | POA: Diagnosis not present

## 2020-06-16 LAB — CBC WITH DIFFERENTIAL/PLATELET
Abs Immature Granulocytes: 0.01 10*3/uL (ref 0.00–0.07)
Basophils Absolute: 0.1 10*3/uL (ref 0.0–0.1)
Basophils Relative: 1 %
Eosinophils Absolute: 0.1 10*3/uL (ref 0.0–0.5)
Eosinophils Relative: 2 %
HCT: 43.9 % (ref 36.0–46.0)
Hemoglobin: 14.5 g/dL (ref 12.0–15.0)
Immature Granulocytes: 0 %
Lymphocytes Relative: 38 %
Lymphs Abs: 2 10*3/uL (ref 0.7–4.0)
MCH: 30.8 pg (ref 26.0–34.0)
MCHC: 33 g/dL (ref 30.0–36.0)
MCV: 93.2 fL (ref 80.0–100.0)
Monocytes Absolute: 0.5 10*3/uL (ref 0.1–1.0)
Monocytes Relative: 9 %
Neutro Abs: 2.7 10*3/uL (ref 1.7–7.7)
Neutrophils Relative %: 50 %
Platelets: 229 10*3/uL (ref 150–400)
RBC: 4.71 MIL/uL (ref 3.87–5.11)
RDW: 13.4 % (ref 11.5–15.5)
WBC: 5.4 10*3/uL (ref 4.0–10.5)
nRBC: 0 % (ref 0.0–0.2)

## 2020-06-16 LAB — COMPREHENSIVE METABOLIC PANEL
ALT: 25 U/L (ref 0–44)
AST: 33 U/L (ref 15–41)
Albumin: 4 g/dL (ref 3.5–5.0)
Alkaline Phosphatase: 74 U/L (ref 38–126)
Anion gap: 4 — ABNORMAL LOW (ref 5–15)
BUN: 15 mg/dL (ref 8–23)
CO2: 28 mmol/L (ref 22–32)
Calcium: 9.3 mg/dL (ref 8.9–10.3)
Chloride: 108 mmol/L (ref 98–111)
Creatinine, Ser: 0.64 mg/dL (ref 0.44–1.00)
GFR, Estimated: >60 mL/min (ref >60)
Glucose, Bld: 92 mg/dL (ref 70–99)
Potassium: 4.1 mmol/L (ref 3.5–5.1)
Sodium: 140 mmol/L (ref 135–145)
Total Bilirubin: 0.8 mg/dL (ref 0.3–1.2)
Total Protein: 7.2 g/dL (ref 6.5–8.1)

## 2020-06-16 LAB — HEPATITIS PANEL, ACUTE
HCV Ab: NONREACTIVE
Hep A IgM: NONREACTIVE
Hep B C IgM: NONREACTIVE
Hepatitis B Surface Ag: NONREACTIVE

## 2020-06-16 LAB — LACTATE DEHYDROGENASE: LDH: 152 U/L (ref 98–192)

## 2020-06-16 NOTE — Progress Notes (Signed)
[facility] CONSULT NOTE  Patient Care Team: Sharilyn Sites, MD as PCP - General (Family Medicine) Gala Romney, Cristopher Estimable, MD as Consulting Physician (Gastroenterology)  CHIEF COMPLAINTS/PURPOSE OF CONSULTATION:  Incidental finding of splenomegaly  ASSESSMENT & PLAN:  Splenomegaly This is a very pleasant 65 year old female patient with hypertension, thyroid disease, asthma referred to hematology for an incidental finding of splenomegaly during her right upper quadrant ultrasound.  Patient complains of intermittent right upper quadrant pain especially when she eats some greasy food, has previously had an ultrasound of the gallbladder and most recently had another complete ultrasound which also demonstrated mild splenomegaly with spleen of 13.2 cm, normal echogenicity, no major findings in the gallbladder.  She is referred to hematology for further recommendations.  No concerning review of systems.  Physical examination completely normal.  We have discussed about common causes of splenomegaly which include but not limited to viral infections, storage diseases, autoimmune diseases, hematological disorders.  However she has no concerning symptoms or signs that suggest a serious hematological disorder.  We have recommended CBC, CMP, LDH, ANA, Hepatitis panel, HIV testing and return to clinic for FU and to discuss additional recommendations.  Orders Placed This Encounter  Procedures  . CBC with Differential/Platelet    Standing Status:   Standing    Number of Occurrences:   22    Standing Expiration Date:   06/16/2021  . Lactate dehydrogenase    Standing Status:   Future    Number of Occurrences:   1    Standing Expiration Date:   06/16/2021  . ANA, IFA (with reflex)    Standing Status:   Future    Number of Occurrences:   1    Standing Expiration Date:   06/16/2021  . Hepatitis panel, acute    Standing Status:   Future    Number of Occurrences:   1    Standing Expiration Date:    06/16/2021  . HIV 1 RNA quant-no reflex-blood    Standing Status:   Future    Number of Occurrences:   1    Standing Expiration Date:   06/16/2021     HISTORY OF PRESENTING ILLNESS:   Heather Raymond 65 y.o. female is here because of incidental finding of splenomegaly during imaging for right upper quadrant pain.  This is a very pleasant 65 year old female patient with past medical history significant for hypertension, hypothyroidism who presented to the radiology with a chief complaint of intermittent right upper quadrant pain.  She had complete US which showed spleen measuring 13.2 cm in length, normal echogenicity.  She absolutely denies any complaints except for intermittent right upper quadrant pain especially when she eats some greasy food.  Otherwise she had an episode of diarrhea and possibly an abdominal infection several weeks ago which has completely resolved.  She has some ongoing environmental allergies.  Rest of the pertinent 10 point ROS reviewed and negative.  No known autoimmune diseases.  No history of hepatitis or HIV.  No hematological disorders.  She cannot remember if she had a previous ultrasound of her spleen to compare.  REVIEW OF SYSTEMS:   Constitutional: Denies fevers, chills or abnormal night sweats Eyes: Denies blurriness of vision, double vision or watery eyes Ears, nose, mouth, throat, and face: Denies mucositis or sore throat Respiratory: Denies cough, dyspnea or wheezes Cardiovascular: Denies palpitation, chest discomfort or lower extremity swelling Gastrointestinal:  Denies nausea, heartburn or change in bowel habits Skin: Denies abnormal skin rashes Lymphatics: Denies  new lymphadenopathy or easy bruising Neurological:Denies numbness, tingling or new weaknesses Behavioral/Psych: Mood is stable, no new changes  All other systems were reviewed with the patient and are negative.  MEDICAL HISTORY:  Past Medical History:  Diagnosis Date  . Asthma   .  Hypertension   . Hypothyroidism   . PONV (postoperative nausea and vomiting)   . Thyroid disease     SURGICAL HISTORY: Past Surgical History:  Procedure Laterality Date  . ABDOMINAL HYSTERECTOMY    . APPENDECTOMY    . BUNIONECTOMY     March 2014  . COLONOSCOPY [name] 06/30/2012   Dr.Rourk- adequate prep, normal rectum, scattered left-sided divertiucula 70m polyp in the ascending segment o/w the remainder of the colon appeared normal. bx=benign lymphoid polyp  . COLONOSCOPY [name] 08/06/2017   Dr. RGala Romney diverticulosis. next tcs in 7 years.   .Lester CarolinaREPAIR [name] 07/21/2018   Procedure: ANTERIOR REPAIR (CYSTOCELE) SLING CYSTOSCOPY;  Surgeon: MBjorn Loser MD;  Location: [facility];  Service: Urology;  Laterality: [name];  . leocolonoscopy  04/29/2007   RFEO:[id number] canal hemorrhoids/Left-sided diverticula/Flat polyp, mid sigmoid colon (hyperplastic), diminutive cecal polyp (tubular adenoma)    SOCIAL HISTORY: Social History   Socioeconomic History  . Marital status: Married    Spouse name: Not on file  . Number of children: 2  . Years of education: Not on file  . Highest education level: Not on file  Occupational History  . Occupation: Retired cAdvertising account planner RETIRED  Tobacco Use  . Smoking status: Never Smoker  . Smokeless tobacco: Never Used  Vaping Use  . Vaping Use: Never used  Substance and Sexual Activity  . Alcohol use: No  . Drug use: No  . Sexual activity: Yes    Birth control/protection: Surgical    Comment: hyst  Other Topics Concern  . Not on file  Social History Narrative   One child living. One son deceased secondary to accident.   Social Determinants of Health   Financial Resource Strain: Low Risk   . Difficulty of Paying Living Expenses: Not hard at all  Food Insecurity: No Food Insecurity  . Worried About RCharity fundraiserin the Last Year: Never true  . Ran Out of Food in the Last Year: Never true  Transportation Needs: No  Transportation Needs  . Lack of Transportation (Medical): No  . Lack of Transportation (Non-Medical): No  Physical Activity: Sufficiently Active  . Days of Exercise per Week: 5 days  . Minutes of Exercise per Session: 60 min  Stress: No Stress Concern Present  . Feeling of Stress : Not at all  Social Connections: Moderately Integrated  . Frequency of Communication with Friends and Family: More than three times a week  . Frequency of Social Gatherings with Friends and Family: More than three times a week  . Attends Religious Services: More than 4 times per year  . Active Member of Clubs or Organizations: No  . Attends CArchivistMeetings: Never  . Marital Status: Married  IHuman resources officerViolence: Not At Risk  . Fear of Current or Ex-Partner: No  . Emotionally Abused: No  . Physically Abused: No  . Sexually Abused: No    FAMILY HISTORY: Family History  Problem Relation Age of Onset  . Multiple myeloma Father        Deceased at age 289 . Allergic rhinitis Mother   . Dementia Mother   . Mental illness Sister  schizophrenia, Bipolar  . Emphysema Paternal Grandfather   . Heart disease Paternal Grandmother   . Cancer Maternal Grandmother        stomach  . Gastric cancer Maternal Grandmother   . Diabetes Maternal Grandfather   . Other Brother        broke neck in MVA  . Arthritis Brother   . Gallstones Brother   . Eczema Sister   . Hypertension Sister   . Suicidality Son   . Obesity Daughter   . Colon cancer Neg Hx   . Angioedema Neg Hx   . Asthma Neg Hx   . Atopy Neg Hx   . Immunodeficiency Neg Hx   . Urticaria Neg Hx     ALLERGIES:  is allergic to other and sulfa antibiotics.  MEDICATIONS:  Current Outpatient Medications  Medication Sig Dispense Refill  . Artificial Tear Solution (SOOTHE XP OP) Place 1 drop into both eyes 2 (two) times daily as needed (for dry eyes).    . Ascorbic Acid (VITAMIN C PO) Take by mouth daily.    . calcium-vitamin D  (OSCAL WITH D) 500-200 MG-UNIT tablet Take 1 tablet by mouth daily.    . cetirizine (ZYRTEC) 10 MG tablet Take 10 mg by mouth in the morning.    . fexofenadine (ALLEGRA) 180 MG tablet Take 180 mg by mouth as needed for allergies or rhinitis.    . fluticasone (FLONASE) 50 MCG/ACT nasal spray Place 1 spray into both nostrils daily.    Marland Kitchen glucosamine-chondroitin 500-400 MG tablet Take 1 tablet by mouth daily.    Marland Kitchen levothyroxine (SYNTHROID, LEVOTHROID) 75 MCG tablet Take 75 mcg by mouth daily before breakfast.     . Multiple Vitamin (MULTIVITAMIN) tablet Take 1 tablet by mouth daily.    . nitrofurantoin, macrocrystal-monohydrate, (MACROBID) 100 MG capsule Take 1 capsule (100 mg total) by mouth 2 (two) times daily. (Patient taking differently: Take 100 mg by mouth daily.) 10 capsule 0  . Probiotic Product (PROBIOTIC DAILY PO) Take by mouth daily.    Marland Kitchen telmisartan (MICARDIS) 80 MG tablet Take 80 mg by mouth at bedtime.     Marland Kitchen albuterol (PROAIR HFA) 108 (90 Base) MCG/ACT inhaler Inhale 4 puffs into the lungs every 4 (four) hours as needed for wheezing or shortness of breath. (Patient not taking: Reported on 06/15/2020) 1 Inhaler 2  . budesonide-formoterol (SYMBICORT) 80-4.5 MCG/ACT inhaler Inhale 1 puff into the lungs 2 (two) times daily. (Patient not taking: Reported on 06/15/2020) 1 Inhaler 2   No current facility-administered medications for this visit.    PHYSICAL EXAMINATION:  ECOG PERFORMANCE STATUS: 0 - Asymptomatic  Vitals:   06/16/20 1023  BP: (!) 148/90  Pulse: 95  Resp: 18  Temp: (!) 97.2 F (36.2 C)  SpO2: 100%   Filed Weights   06/16/20 1023  Weight: 150 lb 4.8 oz (68.2 kg)    GENERAL:alert, no distress and comfortable SKIN: skin color, texture, turgor are normal, no rashes or significant lesions EYES: normal, conjunctiva are pink and non-injected, sclera clear OROPHARYNX:no exudate, no erythema and lips, buccal mucosa, and tongue normal  NECK: supple, thyroid normal size,  non-tender, without nodularity LYMPH:  no palpable lymphadenopathy in the cervical, axillary or inguinal LUNGS: clear to auscultation and percussion with normal breathing effort HEART: regular rate & rhythm and no murmurs and no lower extremity edema ABDOMEN:abdomen soft, non-tender and normal bowel sounds Musculoskeletal:no cyanosis of digits and no clubbing  PSYCH: alert & oriented x 3 with fluent speech  NEURO: no focal motor/sensory deficits  LABORATORY DATA:  I have reviewed the data as listed Lab Results  Component Value Date   WBC 5.4 06/16/2020   HGB 14.5 06/16/2020   HCT 43.9 06/16/2020   MCV 93.2 06/16/2020   PLT 229 06/16/2020     Chemistry      Component Value Date/Time   NA 140 06/16/2020 1120   K 4.1 06/16/2020 1120   CL 108 06/16/2020 1120   CO2 28 06/16/2020 1120   BUN 15 06/16/2020 1120   CREATININE 0.64 06/16/2020 1120      Component Value Date/Time   CALCIUM 9.3 06/16/2020 1120   ALKPHOS 74 06/16/2020 1120   AST 33 06/16/2020 1120   ALT 25 06/16/2020 1120   BILITOT 0.8 06/16/2020 1120      RADIOGRAPHIC STUDIES:  I have personally reviewed the radiological images as listed and agreed with the findings in the report.  US Abdomen Complete  Result Date: 05/26/2020 CLINICAL DATA:  Intermittent right upper quadrant pain. EXAM: ABDOMEN ULTRASOUND COMPLETE COMPARISON:  None. FINDINGS: Gallbladder: No gallstones or wall thickening visualized (2.1 mm). No sonographic Murphy sign noted by sonographer. Common bile duct: Diameter: 3.8 mm Liver: No focal lesion identified. Within normal limits in parenchymal echogenicity. Portal vein is patent on color Doppler imaging with normal direction of blood flow towards the liver. IVC: No abnormality visualized. Pancreas: Visualized portion unremarkable. Spleen: The spleen measures 13.2 cm in length. The splenic parenchyma is normal in echogenicity. Right Kidney: Length: 10.9 cm. Echogenicity within normal limits. No mass is  visualized. A 6 mm shadowing echogenic focus is seen within the right kidney. Mild right-sided hydronephrosis is also noted. Left Kidney: Length: 11.1 cm. Echogenicity within normal limits. No mass or hydronephrosis visualized. Abdominal aorta: No aneurysm visualized (2.7 cm in AP diameter). Other findings: None. IMPRESSION: 1. Splenomegaly. 2. Mild right-sided hydronephrosis with a subcentimeter nonobstructing renal stone within the right kidney. Electronically Signed   By: Virgina Norfolk M.D.   On: 05/26/2020 15:20   All questions were answered. The patient knows to call the clinic with any problems, questions or concerns.     Benay Pike, MD 06/17/2020 12:50 PM

## 2020-06-17 ENCOUNTER — Encounter (HOSPITAL_COMMUNITY): Payer: Self-pay | Admitting: Hematology and Oncology

## 2020-06-17 DIAGNOSIS — R161 Splenomegaly, not elsewhere classified: Secondary | ICD-10-CM | POA: Insufficient documentation

## 2020-06-17 LAB — HIV-1 RNA QUANT-NO REFLEX-BLD
HIV 1 RNA Quant: <20 copies/mL
LOG10 HIV-1 RNA: UNDETERMINED log10copy/mL

## 2020-06-17 NOTE — Assessment & Plan Note (Signed)
This is a very pleasant 65 year old female patient with hypertension, thyroid disease, asthma referred to hematology for an incidental finding of splenomegaly during her right upper quadrant ultrasound.  Patient complains of intermittent right upper quadrant pain especially when she eats some greasy food, has previously had an ultrasound of the gallbladder and most recently had another complete ultrasound which also demonstrated mild splenomegaly with spleen of 13.2 cm, normal echogenicity, no major findings in the gallbladder.  She is referred to hematology for further recommendations.  No concerning review of systems.  Physical examination completely normal.  We have discussed about common causes of splenomegaly which include but not limited to viral infections, storage diseases, autoimmune diseases, hematological disorders.  However she has no concerning symptoms or signs that suggest a serious hematological disorder.  We have recommended CBC, CMP, LDH, ANA, Hepatitis panel, HIV testing and return to clinic for FU and to discuss additional recommendations.

## 2020-06-18 LAB — ANTINUCLEAR ANTIBODIES, IFA: ANA Ab, IFA: POSITIVE — AB

## 2020-06-18 LAB — FANA STAINING PATTERNS: Homogeneous Pattern: 1

## 2020-06-30 ENCOUNTER — Encounter (HOSPITAL_COMMUNITY): Payer: Self-pay | Admitting: Hematology and Oncology

## 2020-06-30 ENCOUNTER — Inpatient Hospital Stay (HOSPITAL_COMMUNITY): Payer: PPO | Admitting: Hematology and Oncology

## 2020-06-30 ENCOUNTER — Other Ambulatory Visit: Payer: Self-pay

## 2020-06-30 VITALS — BP 158/90 | HR 85 | Temp 98.0°F | Resp 16 | Wt 151.4 lb

## 2020-06-30 DIAGNOSIS — R161 Splenomegaly, not elsewhere classified: Secondary | ICD-10-CM

## 2020-06-30 NOTE — Progress Notes (Signed)
[facility] NOTE  Patient Care Team: Sharilyn Sites, MD as PCP - General (Family Medicine) Gala Romney, Cristopher Estimable, MD as Consulting Physician (Gastroenterology)  CHIEF COMPLAINTS/PURPOSE OF CONSULTATION:  Incidental finding of splenomegaly  ASSESSMENT & PLAN:   This is a very pleasant 65 year old female patient who was referred to hematology for evaluation and recommendations regarding an incidental splenomegaly that was noticed on an ultrasound of the abdomen.  Patient denies any symptoms except for intermittent right upper quadrant pain especially when she eats greasy foods suggestive of gallbladder problems. Physical examination on initial visit completely unremarkable, no concerns.  We have discussed about proceeding with lab evaluation, CBC unremarkable, CMP normal, HIV and hepatitis negative, ANA positive with homogeneous staining pattern, LDH is normal.  She is here for follow-up today continues to feel well.  No concerns.  Today I recommended that we will proceed with CT imaging but given contrast shortage we have to proceed without contrast which she understands can have limited evaluation.  I have also ordered additional labs including flow, myeloproliferative work-up, multiple myeloma work-up today. I have also recommended a referral to rheumatology. She will return to clinic in 6 weeks for follow-up to discuss any additional recommendations.  HISTORY OF PRESENTING ILLNESS:   Heather Raymond 65 y.o. female is here because of incidental finding of splenomegaly during imaging for right upper quadrant pain. During her initial visit, we have discussed about common causes of splenomegaly and proceeded with some lab investigation.  She is completely asymptomatic from splenomegaly standpoint, only complains of intermittent right upper quadrant abdominal pain especially when she eats greasy food.  She continues to have those episodes from time to time, otherwise denies any health  complaints since her last visit. She denies any changes in breathing, bowel habits or urinary habits.  She is currently on prophylaxis with nitrofurantoin for urinary tract infections. Rest of the pertinent 10 point ROS reviewed and negative.  MEDICAL HISTORY:  Past Medical History:  Diagnosis Date  . Asthma   . Hypertension   . Hypothyroidism   . PONV (postoperative nausea and vomiting)   . Thyroid disease     SURGICAL HISTORY: Past Surgical History:  Procedure Laterality Date  . ABDOMINAL HYSTERECTOMY    . APPENDECTOMY    . BUNIONECTOMY     March 2014  . COLONOSCOPY [name] 06/30/2012   Dr.Rourk- adequate prep, normal rectum, scattered left-sided divertiucula 42m polyp in the ascending segment o/w the remainder of the colon appeared normal. bx=benign lymphoid polyp  . COLONOSCOPY [name] 08/06/2017   Dr. RGala Romney diverticulosis. next tcs in 7 years.   .Lester CarolinaREPAIR [name] 07/21/2018   Procedure: ANTERIOR REPAIR (CYSTOCELE) SLING CYSTOSCOPY;  Surgeon: MBjorn Loser MD;  Location: [facility];  Service: Urology;  Laterality: [name];  . leocolonoscopy  04/29/2007   RSNK:NLZJQBHanal canal hemorrhoids/Left-sided diverticula/Flat polyp, mid sigmoid colon (hyperplastic), diminutive cecal polyp (tubular adenoma)    SOCIAL HISTORY: Social History   Socioeconomic History  . Marital status: Married    Spouse name: Not on file  . Number of children: 2  . Years of education: Not on file  . Highest education level: Not on file  Occupational History  . Occupation: Retired cAdvertising account planner RETIRED  Tobacco Use  . Smoking status: Never Smoker  . Smokeless tobacco: Never Used  Vaping Use  . Vaping Use: Never used  Substance and Sexual Activity  . Alcohol use: No  . Drug use: No  .  Sexual activity: Yes    Birth control/protection: Surgical    Comment: hyst  Other Topics Concern  . Not on file  Social History Narrative   One child living. One son deceased secondary to accident.    Social Determinants of Health   Financial Resource Strain: Low Risk   . Difficulty of Paying Living Expenses: Not hard at all  Food Insecurity: No Food Insecurity  . Worried About Charity fundraiser in the Last Year: Never true  . Ran Out of Food in the Last Year: Never true  Transportation Needs: No Transportation Needs  . Lack of Transportation (Medical): No  . Lack of Transportation (Non-Medical): No  Physical Activity: Sufficiently Active  . Days of Exercise per Week: 5 days  . Minutes of Exercise per Session: 60 min  Stress: No Stress Concern Present  . Feeling of Stress : Not at all  Social Connections: Moderately Integrated  . Frequency of Communication with Friends and Family: More than three times a week  . Frequency of Social Gatherings with Friends and Family: More than three times a week  . Attends Religious Services: More than 4 times per year  . Active Member of Clubs or Organizations: No  . Attends Archivist Meetings: Never  . Marital Status: Married  Human resources officer Violence: Not At Risk  . Fear of Current or Ex-Partner: No  . Emotionally Abused: No  . Physically Abused: No  . Sexually Abused: No    FAMILY HISTORY: Family History  Problem Relation Age of Onset  . Multiple myeloma Father        Deceased at age 17  . Allergic rhinitis Mother   . Dementia Mother   . Mental illness Sister        schizophrenia, Bipolar  . Emphysema Paternal Grandfather   . Heart disease Paternal Grandmother   . Cancer Maternal Grandmother        stomach  . Gastric cancer Maternal Grandmother   . Diabetes Maternal Grandfather   . Other Brother        broke neck in MVA  . Arthritis Brother   . Gallstones Brother   . Eczema Sister   . Hypertension Sister   . Suicidality Son   . Obesity Daughter   . Colon cancer Neg Hx   . Angioedema Neg Hx   . Asthma Neg Hx   . Atopy Neg Hx   . Immunodeficiency Neg Hx   . Urticaria Neg Hx     ALLERGIES:  is allergic  to other and sulfa antibiotics.  MEDICATIONS:  Current Outpatient Medications  Medication Sig Dispense Refill  . Artificial Tear Solution (SOOTHE XP OP) Place 1 drop into both eyes 2 (two) times daily as needed (for dry eyes).    . Ascorbic Acid (VITAMIN C PO) Take by mouth daily.    . calcium-vitamin D (OSCAL WITH D) 500-200 MG-UNIT tablet Take 1 tablet by mouth daily.    . cetirizine (ZYRTEC) 10 MG tablet Take 10 mg by mouth in the morning.    . fexofenadine (ALLEGRA) 180 MG tablet Take 180 mg by mouth as needed for allergies or rhinitis.    . fluticasone (FLONASE) 50 MCG/ACT nasal spray Place 1 spray into both nostrils daily.    Marland Kitchen glucosamine-chondroitin 500-400 MG tablet Take 1 tablet by mouth daily.    Marland Kitchen levothyroxine (SYNTHROID, LEVOTHROID) 75 MCG tablet Take 75 mcg by mouth daily before breakfast.     . Multiple Vitamin (MULTIVITAMIN)  tablet Take 1 tablet by mouth daily.    . nitrofurantoin, macrocrystal-monohydrate, (MACROBID) 100 MG capsule Take 1 capsule (100 mg total) by mouth 2 (two) times daily. (Patient taking differently: Take 100 mg by mouth daily.) 10 capsule 0  . Probiotic Product (PROBIOTIC DAILY PO) Take by mouth daily.    Marland Kitchen telmisartan (MICARDIS) 80 MG tablet Take 80 mg by mouth at bedtime.     Marland Kitchen albuterol (PROAIR HFA) 108 (90 Base) MCG/ACT inhaler Inhale 4 puffs into the lungs every 4 (four) hours as needed for wheezing or shortness of breath. (Patient not taking: Reported on 06/30/2020) 1 Inhaler 2  . budesonide-formoterol (SYMBICORT) 80-4.5 MCG/ACT inhaler Inhale 1 puff into the lungs 2 (two) times daily. (Patient not taking: No sig reported) 1 Inhaler 2   No current facility-administered medications for this visit.    PHYSICAL EXAMINATION:  ECOG PERFORMANCE STATUS: 0 - Asymptomatic  Vitals:   06/30/20 1115  BP: (!) 158/90  Pulse: 85  Resp: 16  Temp: 98 F (36.7 C)  SpO2: 100%   Filed Weights   06/30/20 1115  Weight: 151 lb 6.4 oz (68.7 kg)   PE deferred  in lieu of counseling.  LABORATORY DATA:  I have reviewed the data as listed Lab Results  Component Value Date   WBC 5.4 06/16/2020   HGB 14.5 06/16/2020   HCT 43.9 06/16/2020   MCV 93.2 06/16/2020   PLT 229 06/16/2020     Chemistry      Component Value Date/Time   NA 140 06/16/2020 1120   K 4.1 06/16/2020 1120   CL 108 06/16/2020 1120   CO2 28 06/16/2020 1120   BUN 15 06/16/2020 1120   CREATININE 0.64 06/16/2020 1120      Component Value Date/Time   CALCIUM 9.3 06/16/2020 1120   ALKPHOS 74 06/16/2020 1120   AST 33 06/16/2020 1120   ALT 25 06/16/2020 1120   BILITOT 0.8 06/16/2020 1120     Reviewed pertinent results from last visit.  CBC is entirely unremarkable.  No elevation of LDH suggesting hemolysis.  ANA positive, homogeneous patterns.  HIV and hepatitis panel nonreactive.  CMP is unremarkable.  RADIOGRAPHIC STUDIES:  I have personally reviewed the radiological images as listed and agreed with the findings in the report.  No results found. All questions were answered. The patient knows to call the clinic with any problems, questions or concerns.     Benay Pike, MD 06/30/2020 11:40 AM

## 2020-07-06 DIAGNOSIS — N3946 Mixed incontinence: Secondary | ICD-10-CM | POA: Diagnosis not present

## 2020-07-06 DIAGNOSIS — N3000 Acute cystitis without hematuria: Secondary | ICD-10-CM | POA: Diagnosis not present

## 2020-08-14 ENCOUNTER — Ambulatory Visit (HOSPITAL_COMMUNITY): Payer: PPO | Admitting: Hematology

## 2020-08-15 NOTE — Progress Notes (Signed)
Office Visit Note  Patient: Heather Raymond             Date of Birth: Feb 13, 1955           MRN: [id number]             PCP: Sharilyn Sites, MD Referring: Benay Pike, MD Visit Date: 08/29/2020 Occupation: @GUAROCC @  Subjective:  Pain in both hands, positive ANA   History of Present Illness: Heather Raymond is a 65 y.o. female seen in consultation per request of her PCP for joint pain and positive ANA.  According the patient for the last 5 years she has had intermittent pain in her hands.  In the last 2 months she has noticed increased pain in her hands and the dorsum of her hands.  She has noticed intermittent swelling in her hands.  None of the other joints are painful.  She states after prolonged standing she feels some discomfort in her left knee joint.  She had bunionectomy on her left foot 2014.  She also has a bunion on her right foot.  There is no family history of rheumatoid arthritis or lupus.  Her sister has ankylosing spondylitis.  There is no personal history of psoriasis.  There is questionable history of psoriasis in her sister.  She is gravida 2, para 2, miscarriages 0.  Patient is retired now.  She used to work as an Surveyor, quantity.  She raises chickens, walks on a regular basis, she enjoys gardening cooking and reading.  Activities of Daily Living:  Patient reports morning stiffness for 1 hour.   Patient Denies nocturnal pain.  Difficulty dressing/grooming: Denies Difficulty climbing stairs: Denies Difficulty getting out of chair: Denies Difficulty using hands for taps, buttons, cutlery, and/or writing: Denies  Review of Systems  Constitutional:  Negative for fatigue.  HENT:  Negative for mouth sores, mouth dryness and nose dryness.   Eyes:  Positive for dryness. Negative for pain and itching.  Respiratory:  Negative for shortness of breath and difficulty breathing.   Cardiovascular:  Negative for chest pain and palpitations.  Gastrointestinal:  Positive for  diarrhea. Negative for blood in stool and constipation.  Endocrine: Negative for increased urination.  Genitourinary:  Negative for difficulty urinating.  Musculoskeletal:  Positive for joint pain, joint pain, joint swelling and morning stiffness. Negative for myalgias, muscle tenderness and myalgias.  Skin:  Negative for color change, rash and redness.  Allergic/Immunologic: Positive for susceptible to infections.  Neurological:  Negative for dizziness, numbness, headaches, memory loss and weakness.  Hematological:  Negative for bruising/bleeding tendency.  Psychiatric/Behavioral:  Negative for confusion.    PMFS History:  Patient Active Problem List   Diagnosis Date Noted   Diverticulosis 08/29/2020   Splenomegaly 06/17/2020   Heartburn 11/24/2019   Loose stools 11/24/2019   Diarrhea 09/10/2019   Abdominal pain 09/10/2019   Nausea without vomiting 09/10/2019   Moderate persistent asthma without complication 46/56/8127   Anaphylactic shock due to adverse food reaction 08/05/2019   Cystocele with prolapse 07/21/2018   Constipation 11/21/2017   Well woman exam with routine gynecological exam 03/17/2017   SUI (stress urinary incontinence, female) 02/17/2017   Female cystocele 01/02/2017   Post-menopausal atrophic vaginitis 01/02/2017   Perennial allergic rhinitis 01/09/2016   Adverse food reaction 01/09/2016   Mild persistent asthma, uncomplicated 51/70/0174   Abdominal pain, chronic, right upper quadrant 11/04/2013   Hx of adenomatous colonic polyps 11/04/2013   Encounter for colonoscopy due to history of adenomatous colonic polyps  06/01/2012    Past Medical History:  Diagnosis Date   Asthma    Hypertension    Hypothyroidism    PONV (postoperative nausea and vomiting)    Thyroid disease     Family History  Problem Relation Age of Onset   Allergic rhinitis Mother    Dementia Mother    Multiple myeloma Father        Deceased at age 69   Mental illness Sister         schizophrenia, Bipolar   Ankylosing spondylitis Sister    Eczema Sister    Hypertension Sister    Other Brother        broke neck in MVA   Arthritis Brother    Gallstones Brother    Cancer Maternal Grandmother        stomach   Gastric cancer Maternal Grandmother    Diabetes Maternal Grandfather    Heart disease Paternal Grandmother    Emphysema Paternal Grandfather    Obesity Daughter    Supraventricular tachycardia Daughter    Suicidality Son    Colon cancer Neg Hx    Angioedema Neg Hx    Asthma Neg Hx    Atopy Neg Hx    Immunodeficiency Neg Hx    Urticaria Neg Hx    Past Surgical History:  Procedure Laterality Date   ABDOMINAL HYSTERECTOMY     APPENDECTOMY     BUNIONECTOMY     March 2014   COLONOSCOPY [name] 06/30/2012   Dr.Rourk- adequate prep, normal rectum, scattered left-sided divertiucula 59m polyp in the ascending segment o/w the remainder of the colon appeared normal. bx=benign lymphoid polyp   COLONOSCOPY [name] 08/06/2017   Dr. RGala Romney diverticulosis. next tcs in 7 years.    CYSTOCELE REPAIR [name] 07/21/2018   Procedure: ANTERIOR REPAIR (CYSTOCELE) SLING CYSTOSCOPY;  Surgeon: MBjorn Loser MD;  Location: [facility];  Service: Urology;  Laterality: [name];   leocolonoscopy  04/29/2007   RWUJ:[id number] canal hemorrhoids/Left-sided diverticula/Flat polyp, mid sigmoid colon (hyperplastic), diminutive cecal polyp (tubular adenoma)   Social History   Social History Narrative   One child living. One son deceased secondary to accident.   Immunization History  Administered Date(s) Administered   MMarriottVaccination 02/25/2019, 04/02/2019, 01/31/2020     Objective: Vital Signs: BP (!) 152/93 (BP Location: Right Arm, Patient Position: Sitting, Cuff Size: Normal)   Pulse 74   Resp 14   Ht 5' 2.75" (1.594 m)   Wt 150 lb 6.4 oz (68.2 kg)   BMI 26.85 kg/m    Physical Exam Vitals and nursing note reviewed.  Constitutional:      Appearance: She is  well-developed.  HENT:     Head: Normocephalic and atraumatic.  Eyes:     Conjunctiva/sclera: Conjunctivae normal.  Cardiovascular:     Rate and Rhythm: Normal rate and regular rhythm.     Heart sounds: Normal heart sounds.  Pulmonary:     Effort: Pulmonary effort is normal.     Breath sounds: Normal breath sounds.  Abdominal:     General: Bowel sounds are normal.     Palpations: Abdomen is soft.  Musculoskeletal:     Cervical back: Normal range of motion.  Lymphadenopathy:     Cervical: No cervical adenopathy.  Skin:    General: Skin is warm and dry.     Capillary Refill: Capillary refill takes less than 2 seconds.  Neurological:     Mental Status: She is alert and oriented to person, place, and  time.  Psychiatric:        Behavior: Behavior normal.     Musculoskeletal Exam: C-spine thoracic and lumbar spine were in good range of motion.  She had no SI joint tenderness.  Shoulder joints, elbow joints, wrist joints, MCPs PIPs and DIPs with good range of motion.  She had bilateral PIP and DIP thickening with no synovitis.  Hip joints with good range of motion.  Knee joints with good range of motion well without any warmth swelling or effusion.  She had left bunionectomy scar.  She had right hallux valgus deformity.  No synovitis was noted.  CDAI Exam: CDAI Score: -- Patient Global: --; Provider Global: -- Swollen: --; Tender: -- Joint Exam 08/29/2020   No joint exam has been documented for this visit   There is currently no information documented on the homunculus. Go to the Rheumatology activity and complete the homunculus joint exam.  Investigation: No additional findings.  Imaging: CT Abdomen Pelvis Wo Contrast  Result Date: 08/25/2020 CLINICAL DATA:  Splenomegaly, history of nephrolithiasis EXAM: CT ABDOMEN AND PELVIS WITHOUT CONTRAST TECHNIQUE: Multidetector CT imaging of the abdomen and pelvis was performed following the standard protocol without IV contrast.  COMPARISON:  05/26/2020, 06/01/2018 FINDINGS: Lower chest: No acute abnormality. Hepatobiliary: Limited without IV contrast. No large focal hepatic abnormality or intrahepatic biliary dilatation. Gallbladder nondistended. Common bile duct nondilated. Pancreas: Unremarkable. No pancreatic ductal dilatation or surrounding inflammatory changes. Spleen: Normal in size without focal abnormality. Spleen measures 10.3 cm in craniocaudal length. Adrenals/Urinary Tract: Normal adrenal glands. Right kidney has a similar extrarenal pelvis. No renal obstruction or hydronephrosis. No obstructing urinary tract or ureteral calculus. Bladder unremarkable. Stomach/Bowel: Negative for bowel obstruction, significant dilatation, ileus, or free air. Remote appendectomy clips noted. Scattered minor colonic diverticulosis. No acute inflammatory process. No free fluid, fluid collection, hemorrhage, hematoma, abscess or ascites. Vascular/Lymphatic: Limited without IV contrast. Negative for aneurysm. No retroperitoneal hemorrhage or hematoma. No bulky adenopathy. Reproductive: Status post hysterectomy. No adnexal masses. Other: No abdominal wall hernia or abnormality. No abdominopelvic ascites. Musculoskeletal: Degenerative changes noted of the lumbar spine, facet joints and SI joints. No acute osseous finding. IMPRESSION: No acute finding by noncontrast CT.  Spleen is normal in size. Remote appendectomy and hysterectomy Scattered colonic diverticulosis without acute inflammatory process Electronically Signed   By: Jerilynn Mages.  Shick M.D.   On: 08/25/2020 09:11   XR Hand 2 View Left  Result Date: 08/29/2020 CMC, PIP and DIP narrowing was noted.  No MCP, intercarpal or radiocarpal joint space narrowing was noted.  No erosive changes were noted. Impression: These findings are consistent with osteoarthritis of the hand.  XR Hand 2 View Right  Result Date: 08/29/2020 CMC, PIP and DIP narrowing was noted.  No MCP, intercarpal or radiocarpal joint  space narrowing was noted.  No erosive changes were noted. Impression: These findings are consistent with osteoarthritis of the hand.   Recent Labs: Lab Results  Component Value Date   WBC 5.4 06/16/2020   HGB 14.5 06/16/2020   PLT 229 06/16/2020   NA 140 06/16/2020   K 4.1 06/16/2020   CL 108 06/16/2020   CO2 28 06/16/2020   GLUCOSE 92 06/16/2020   BUN 15 06/16/2020   CREATININE 0.64 06/16/2020   BILITOT 0.8 06/16/2020   ALKPHOS 74 06/16/2020   AST 33 06/16/2020   ALT 25 06/16/2020   PROT 7.2 06/16/2020   ALBUMIN 4.0 06/16/2020   CALCIUM 9.3 06/16/2020   GFRAA >60 07/22/2018  Speciality Comments: No specialty comments available.  Procedures:  No procedures performed Allergies: Other and Sulfa antibiotics   Assessment / Plan:     Visit Diagnoses: Pain in both hands -she complains of pain and discomfort in her bilateral hands for the last 5 years which is increased in the last 2 months.  She had bilateral PIP and DIP thickening with no synovitis.  Plan: XR Hand 2 View Right, XR Hand 2 View Left.  X-ray findings were reviewed with the patient.  X-rays were consistent with osteoarthritis.  Joint protection muscle strengthening was discussed.  Natural anti-inflammatories were discussed.  A handout on hand exercises was given.  Use of arthritis gloves and Voltaren gel was also discussed.  Chronic pain of left knee-she complains of discomfort in the left knee joint after prolonged standing.  She declined x-ray.  Have given her a handout on lower extremity muscle strengthening exercises.  We will see response with.  Hallux valgus (acquired), right foot-it is not symptomatic currently.  History of bunionectomy of left great toe-she had surgery in the past.  Positive ANA (antinuclear antibody) - 06/16/20: ANA 1:80H, HIV-, Hep A, B, and C non-reactive.  She has low titer positive ANA.  There is no history of oral ulcers, nasal ulcers, she gives history of dry eyes.  There is no history  of Raynaud's phenomenon, lymphadenopathy or malar rash.  I will obtain AVISE labs.  Mild intermittent asthma without complication-stable with inhalers.  Diverticulosis-it was found on the recent CT scan of the abdomen which I reviewed with the patient.  Hx of adenomatous colonic polyps  Cystocele with prolapse - s/p surgery and bladder sling  Anaphylactic shock due to food, subsequent encounter - cilantro  Family history of ankylosing spondylitis - sister.  Patient states her sister is on anti-inflammatories.  Orders: Orders Placed This Encounter  Procedures   XR Hand 2 View Right   XR Hand 2 View Left    No orders of the defined types were placed in this encounter.    Follow-Up Instructions: Return for OA, +ANA.   Bo Merino, MD  Note - This record has been created using Editor, commissioning.  Chart creation errors have been sought, but may not always  have been located. Such creation errors do not reflect on  the standard of medical care.

## 2020-08-24 ENCOUNTER — Ambulatory Visit (HOSPITAL_COMMUNITY)
Admission: RE | Admit: 2020-08-24 | Discharge: 2020-08-24 | Disposition: A | Discharge status: Home / Self Care | Payer: PPO | Source: Ambulatory Visit | Attending: Hematology and Oncology | Admitting: Hematology and Oncology

## 2020-08-24 ENCOUNTER — Other Ambulatory Visit: Payer: Self-pay

## 2020-08-24 DIAGNOSIS — Z87442 Personal history of urinary calculi: Secondary | ICD-10-CM | POA: Diagnosis not present

## 2020-08-24 DIAGNOSIS — R161 Splenomegaly, not elsewhere classified: Secondary | ICD-10-CM | POA: Insufficient documentation

## 2020-08-24 DIAGNOSIS — K5730 Diverticulosis of large intestine without perforation or abscess without bleeding: Secondary | ICD-10-CM | POA: Diagnosis not present

## 2020-08-24 DIAGNOSIS — M47816 Spondylosis without myelopathy or radiculopathy, lumbar region: Secondary | ICD-10-CM | POA: Diagnosis not present

## 2020-08-28 ENCOUNTER — Inpatient Hospital Stay (HOSPITAL_COMMUNITY): Payer: PPO | Attending: Hematology and Oncology | Admitting: Physician Assistant

## 2020-08-28 ENCOUNTER — Other Ambulatory Visit: Payer: Self-pay

## 2020-08-28 VITALS — BP 142/91 | HR 82 | Temp 96.9°F | Resp 18

## 2020-08-28 DIAGNOSIS — I10 Essential (primary) hypertension: Secondary | ICD-10-CM | POA: Insufficient documentation

## 2020-08-28 DIAGNOSIS — E039 Hypothyroidism, unspecified: Secondary | ICD-10-CM | POA: Insufficient documentation

## 2020-08-28 DIAGNOSIS — R161 Splenomegaly, not elsewhere classified: Secondary | ICD-10-CM | POA: Insufficient documentation

## 2020-08-28 NOTE — Progress Notes (Signed)
[place]  Hematology and Oncology Progress Note  Patient Care Team: Sharilyn Sites, MD as PCP - General (Family Medicine) Gala Romney Cristopher Estimable, MD as Consulting Physician (Gastroenterology)  CHIEF COMPLAINTS:  Incidental finding of splenomegaly  HISTORY OF PRESENTING ILLNESS:   Heather Raymond 65 y.o. female is here for a follow up due to incidental finding of splenomegaly seen on abdominal US in April 2022. Patient is unaccompanied for this visit. She reports feeling well without any no symptoms or changes to her health. She has stable energy levels and continues to complete her ADLs on her own. She has a good appetite without any weight changes. She denies any nausea or vomiting. She has occasional episodes of RUQ abdominal pain, mainly after eating greasy foods. She denies any diarrhea or constipation. She denies easy bruising or signs of bleeding. She is currently on prophylaxis with nitrofurantoin for urinary tract infections. She denies any fevers, chills, night sweats, shortness of breath ,chest pain or cough. She has no other complaints.   REVIEW OF SYSTEMS: Review of Systems  Constitutional:  Negative for chills, fever and weight loss.  Respiratory:  Negative for cough, hemoptysis, shortness of breath and wheezing.   Cardiovascular:  Negative for chest pain, palpitations and leg swelling.  Gastrointestinal:  Negative for blood in stool, constipation, diarrhea, melena, nausea and vomiting.  Musculoskeletal:  Negative for back pain.  Skin:  Negative for rash.  Neurological:  Negative for dizziness, tingling and headaches.    MEDICAL HISTORY:  Past Medical History:  Diagnosis Date   Asthma    Hypertension    Hypothyroidism    PONV (postoperative nausea and vomiting)    Thyroid disease     SURGICAL HISTORY: Past Surgical History:  Procedure Laterality Date   ABDOMINAL HYSTERECTOMY     APPENDECTOMY     BUNIONECTOMY     March 2014   COLONOSCOPY [name] 06/30/2012    Dr.Rourk- adequate prep, normal rectum, scattered left-sided divertiucula 63m polyp in the ascending segment o/w the remainder of the colon appeared normal. bx=benign lymphoid polyp   COLONOSCOPY [name] 08/06/2017   Dr. RGala Romney diverticulosis. next tcs in 7 years.    CYSTOCELE REPAIR [name] 07/21/2018   Procedure: ANTERIOR REPAIR (CYSTOCELE) SLING CYSTOSCOPY;  Surgeon: MBjorn Loser MD;  Location: [facility];  Service: Urology;  Laterality: [name];   leocolonoscopy  04/29/2007   RVCB:SWHQPRFanal canal hemorrhoids/Left-sided diverticula/Flat polyp, mid sigmoid colon (hyperplastic), diminutive cecal polyp (tubular adenoma)    SOCIAL HISTORY: Social History   Socioeconomic History   Marital status: Married    Spouse name: Not on file   Number of children: 2   Years of education: Not on file   Highest education level: Not on file  Occupational History   Occupation: Retired cScientist, clinical (histocompatibility and immunogenetics)of court    Employer: RETIRED  Tobacco Use   Smoking status: Never   Smokeless tobacco: Never  Vaping Use   Vaping Use: Never used  Substance and Sexual Activity   Alcohol use: No   Drug use: No   Sexual activity: Yes    Birth control/protection: Surgical    Comment: hyst  Other Topics Concern   Not on file  Social History Narrative   One child living. One son deceased secondary to accident.   Social Determinants of Health   Financial Resource Strain: Low Risk    Difficulty of Paying Living Expenses: Not hard at all  Food Insecurity: No Food Insecurity   Worried About RCharity fundraiser  in the Last Year: Never true   [place] in the Last Year: Never true  Transportation Needs: No Transportation Needs   Lack of Transportation (Medical): No   Lack of Transportation (Non-Medical): No  Physical Activity: Sufficiently Active   Days of Exercise per Week: 5 days   Minutes of Exercise per Session: 60 min  Stress: No Stress Concern Present   Feeling of Stress : Not at all  Social Connections: Moderately  Integrated   Frequency of Communication with Friends and Family: More than three times a week   Frequency of Social Gatherings with Friends and Family: More than three times a week   Attends Religious Services: More than 4 times per year   Active Member of [employer] or Organizations: No   Attends Music therapist: Never   Marital Status: Married  Human resources officer Violence: Not At Risk   Fear of Current or Ex-Partner: No   Emotionally Abused: No   Physically Abused: No   Sexually Abused: No    FAMILY HISTORY: Family History  Problem Relation Age of Onset   Multiple myeloma Father        Deceased at age 78   Allergic rhinitis Mother    Dementia Mother    Mental illness Sister        schizophrenia, Bipolar   Emphysema Paternal Grandfather    Heart disease Paternal Grandmother    Cancer Maternal Grandmother        stomach   Gastric cancer Maternal Grandmother    Diabetes Maternal Grandfather    Other Brother        broke neck in MVA   Arthritis Brother    Gallstones Brother    Eczema Sister    Hypertension Sister    Suicidality Son    Obesity Daughter    Colon cancer Neg Hx    Angioedema Neg Hx    Asthma Neg Hx    Atopy Neg Hx    Immunodeficiency Neg Hx    Urticaria Neg Hx     ALLERGIES:  is allergic to other and sulfa antibiotics.  MEDICATIONS:  Current Outpatient Medications  Medication Sig Dispense Refill   Ascorbic Acid (VITAMIN C PO) Take by mouth daily.     calcium-vitamin D (OSCAL WITH D) 500-200 MG-UNIT tablet Take 1 tablet by mouth daily.     cetirizine (ZYRTEC) 10 MG tablet Take 10 mg by mouth in the morning.     fexofenadine (ALLEGRA) 180 MG tablet Take 180 mg by mouth as needed for allergies or rhinitis.     fluticasone (FLONASE) 50 MCG/ACT nasal spray Place 1 spray into both nostrils daily.     glucosamine-chondroitin 500-400 MG tablet Take 1 tablet by mouth daily.     levothyroxine (SYNTHROID, LEVOTHROID) 75 MCG tablet Take 75 mcg by mouth  daily before breakfast.      Multiple Vitamin (MULTIVITAMIN) tablet Take 1 tablet by mouth daily.     nitrofurantoin, macrocrystal-monohydrate, (MACROBID) 100 MG capsule Take 1 capsule (100 mg total) by mouth 2 (two) times daily. (Patient taking differently: Take 100 mg by mouth daily.) 10 capsule 0   Probiotic Product (PROBIOTIC DAILY PO) Take by mouth daily.     telmisartan (MICARDIS) 80 MG tablet Take 80 mg by mouth at bedtime.      albuterol (PROAIR HFA) 108 (90 Base) MCG/ACT inhaler Inhale 4 puffs into the lungs every 4 (four) hours as needed for wheezing or shortness of breath. (Patient not taking:  Reported on 08/28/2020) 1 Inhaler 2   Artificial Tear Solution (SOOTHE XP OP) Place 1 drop into both eyes 2 (two) times daily as needed (for dry eyes). (Patient not taking: Reported on 08/28/2020)     budesonide-formoterol (SYMBICORT) 80-4.5 MCG/ACT inhaler Inhale 1 puff into the lungs 2 (two) times daily. (Patient not taking: No sig reported) 1 Inhaler 2   No current facility-administered medications for this visit.    PHYSICAL EXAMINATION:  ECOG PERFORMANCE STATUS: 0 - Asymptomatic  Vitals:   08/28/20 1324  BP: (!) 142/91  Pulse: 82  Resp: 18  Temp: (!) 96.9 F (36.1 C)  SpO2: 99%   Physical Exam Constitutional:      Appearance: Normal appearance.  HENT:     Head: Normocephalic.     Mouth/Throat:     Mouth: Mucous membranes are moist.     Pharynx: Oropharynx is clear.  Eyes:     Extraocular Movements: Extraocular movements intact.  Cardiovascular:     Rate and Rhythm: Normal rate and regular rhythm.  Pulmonary:     Effort: Pulmonary effort is normal.     Breath sounds: Normal breath sounds.  Abdominal:     General: Abdomen is flat. Bowel sounds are normal.     Palpations: Abdomen is soft.  Musculoskeletal:        General: No swelling.  Skin:    General: Skin is warm and dry.  Neurological:     General: No focal deficit present.     Mental Status: She is alert and  oriented to person, place, and time.    LABORATORY DATA:  I have reviewed the data as listed Lab Results  Component Value Date   WBC 5.4 06/16/2020   HGB 14.5 06/16/2020   HCT 43.9 06/16/2020   MCV 93.2 06/16/2020   PLT 229 06/16/2020     Chemistry      Component Value Date/Time   NA 140 06/16/2020 1120   K 4.1 06/16/2020 1120   CL 108 06/16/2020 1120   CO2 28 06/16/2020 1120   BUN 15 06/16/2020 1120   CREATININE 0.64 06/16/2020 1120      Component Value Date/Time   CALCIUM 9.3 06/16/2020 1120   ALKPHOS 74 06/16/2020 1120   AST 33 06/16/2020 1120   ALT 25 06/16/2020 1120   BILITOT 0.8 06/16/2020 1120      RADIOGRAPHIC STUDIES:  I have personally reviewed the radiological images as listed and agreed with the findings in the report.  CT Abdomen Pelvis Wo Contrast  Result Date: 08/25/2020 CLINICAL DATA:  Splenomegaly, history of nephrolithiasis EXAM: CT ABDOMEN AND PELVIS WITHOUT CONTRAST TECHNIQUE: Multidetector CT imaging of the abdomen and pelvis was performed following the standard protocol without IV contrast. COMPARISON:  05/26/2020, 06/01/2018 FINDINGS: Lower chest: No acute abnormality. Hepatobiliary: Limited without IV contrast. No large focal hepatic abnormality or intrahepatic biliary dilatation. Gallbladder nondistended. Common bile duct nondilated. Pancreas: Unremarkable. No pancreatic ductal dilatation or surrounding inflammatory changes. Spleen: Normal in size without focal abnormality. Spleen measures 10.3 cm in craniocaudal length. Adrenals/Urinary Tract: Normal adrenal glands. Right kidney has a similar extrarenal pelvis. No renal obstruction or hydronephrosis. No obstructing urinary tract or ureteral calculus. Bladder unremarkable. Stomach/Bowel: Negative for bowel obstruction, significant dilatation, ileus, or free air. Remote appendectomy clips noted. Scattered minor colonic diverticulosis. No acute inflammatory process. No free fluid, fluid collection,  hemorrhage, hematoma, abscess or ascites. Vascular/Lymphatic: Limited without IV contrast. Negative for aneurysm. No retroperitoneal hemorrhage or hematoma. No bulky adenopathy. Reproductive:  Status post hysterectomy. No adnexal masses. Other: No abdominal wall hernia or abnormality. No abdominopelvic ascites. Musculoskeletal: Degenerative changes noted of the lumbar spine, facet joints and SI joints. No acute osseous finding. IMPRESSION: No acute finding by noncontrast CT.  Spleen is normal in size. Remote appendectomy and hysterectomy Scattered colonic diverticulosis without acute inflammatory process Electronically Signed   By: Jerilynn Mages.  Shick M.D.   On: 08/25/2020 09:11     ASSESSMENT & PLAN:  Heather Raymond is a 65 y.o. who returns for a follow up regarding an incidental splenomegaly that was noticed on an ultrasound of the abdomen in April 2022.  #Splenomegaly: --Labs from 06/16/2020 showed CBC unremarkable, CMP normal, HIV and hepatitis negative, ANA positive with homogeneous staining pattern, LDH is normal.   --CT abdomen/pelvis on 08/24/2020 shows no acute findings and spleen is normal in size.  --No further workup is required.  --Return to clinic will be pending.   #ANA positive: --Scheduled for rheumatology consultation tomorrow 08/29/2020.   Patient expressed understanding and satisfaction with the plan provided.   I have spent a total of 25 minutes minutes of face-to-face and non-face-to-face time, preparing to see the patient, obtaining and/or reviewing separately obtained history, performing a medically appropriate examination, counseling and educating the patient, documenting clinical information in the electronic health record, and care coordination.   Lincoln Brigham, PA-C Hematology and [place] at [facility]

## 2020-08-29 ENCOUNTER — Encounter: Payer: Self-pay | Admitting: Rheumatology

## 2020-08-29 ENCOUNTER — Ambulatory Visit: Payer: Self-pay

## 2020-08-29 ENCOUNTER — Ambulatory Visit: Payer: PPO | Admitting: Rheumatology

## 2020-08-29 VITALS — BP 152/93 | HR 74 | Resp 14 | Ht 62.75 in | Wt 150.4 lb

## 2020-08-29 DIAGNOSIS — M79642 Pain in left hand: Secondary | ICD-10-CM | POA: Diagnosis not present

## 2020-08-29 DIAGNOSIS — M2011 Hallux valgus (acquired), right foot: Secondary | ICD-10-CM | POA: Diagnosis not present

## 2020-08-29 DIAGNOSIS — R768 Other specified abnormal immunological findings in serum: Secondary | ICD-10-CM | POA: Diagnosis not present

## 2020-08-29 DIAGNOSIS — T7800XD Anaphylactic reaction due to unspecified food, subsequent encounter: Secondary | ICD-10-CM | POA: Diagnosis not present

## 2020-08-29 DIAGNOSIS — K5790 Diverticulosis of intestine, part unspecified, without perforation or abscess without bleeding: Secondary | ICD-10-CM | POA: Diagnosis not present

## 2020-08-29 DIAGNOSIS — Z86010 Personal history of colonic polyps: Secondary | ICD-10-CM

## 2020-08-29 DIAGNOSIS — M25562 Pain in left knee: Secondary | ICD-10-CM | POA: Diagnosis not present

## 2020-08-29 DIAGNOSIS — J4540 Moderate persistent asthma, uncomplicated: Secondary | ICD-10-CM

## 2020-08-29 DIAGNOSIS — N814 Uterovaginal prolapse, unspecified: Secondary | ICD-10-CM | POA: Diagnosis not present

## 2020-08-29 DIAGNOSIS — Z8269 Family history of other diseases of the musculoskeletal system and connective tissue: Secondary | ICD-10-CM

## 2020-08-29 DIAGNOSIS — N8110 Cystocele, unspecified: Secondary | ICD-10-CM

## 2020-08-29 DIAGNOSIS — N952 Postmenopausal atrophic vaginitis: Secondary | ICD-10-CM

## 2020-08-29 DIAGNOSIS — R161 Splenomegaly, not elsewhere classified: Secondary | ICD-10-CM

## 2020-08-29 DIAGNOSIS — Z9889 Other specified postprocedural states: Secondary | ICD-10-CM | POA: Diagnosis not present

## 2020-08-29 DIAGNOSIS — M79641 Pain in right hand: Secondary | ICD-10-CM | POA: Diagnosis not present

## 2020-08-29 DIAGNOSIS — J4520 Mild intermittent asthma, uncomplicated: Secondary | ICD-10-CM | POA: Diagnosis not present

## 2020-08-29 DIAGNOSIS — G8929 Other chronic pain: Secondary | ICD-10-CM

## 2020-08-29 DIAGNOSIS — D8989 Other specified disorders involving the immune mechanism, not elsewhere classified: Secondary | ICD-10-CM | POA: Diagnosis not present

## 2020-08-29 NOTE — Patient Instructions (Addendum)
Hand Exercises Hand exercises can be helpful for almost anyone. These exercises can strengthen the hands, improve flexibility and movement, and increase blood flow to the hands. These results can make work and daily tasks easier. Hand exercises can be especially helpful for people who have joint pain from arthritis or have nerve damage from overuse (carpal tunnel syndrome). These exercises can also help people who have injured a hand. Exercises Most of these hand exercises are gentle stretching and motion exercises. It is usually safe to do them often throughout the day. Warming up your hands before exercise may help to reduce stiffness. You can do this with gentle massage orby placing your hands in warm water for 10-15 minutes. It is normal to feel some stretching, pulling, tightness, or mild discomfort as you begin new exercises. This will gradually improve. Stop an exercise right away if you feel sudden, severe pain or your pain gets worse. Ask your healthcare provider which exercises are best for you. Knuckle bend or "claw" fist Stand or sit with your arm, hand, and all five fingers pointed straight up. Make sure to keep your wrist straight during the exercise. Gently bend your fingers down toward your palm until the tips of your fingers are touching the top of your palm. Keep your big knuckle straight and just bend the small knuckles in your fingers. Hold this position for __________ seconds. Straighten (extend) your fingers back to the starting position. Repeat this exercise 5-10 times with each hand. Full finger fist Stand or sit with your arm, hand, and all five fingers pointed straight up. Make sure to keep your wrist straight during the exercise. Gently bend your fingers into your palm until the tips of your fingers are touching the middle of your palm. Hold this position for __________ seconds. Extend your fingers back to the starting position, stretching every joint fully. Repeat this  exercise 5-10 times with each hand. Straight fist Stand or sit with your arm, hand, and all five fingers pointed straight up. Make sure to keep your wrist straight during the exercise. Gently bend your fingers at the big knuckle, where your fingers meet your hand, and the middle knuckle. Keep the knuckle at the tips of your fingers straight and try to touch the bottom of your palm. Hold this position for __________ seconds. Extend your fingers back to the starting position, stretching every joint fully. Repeat this exercise 5-10 times with each hand. Tabletop Stand or sit with your arm, hand, and all five fingers pointed straight up. Make sure to keep your wrist straight during the exercise. Gently bend your fingers at the big knuckle, where your fingers meet your hand, as far down as you can while keeping the small knuckles in your fingers straight. Think of forming a tabletop with your fingers. Hold this position for __________ seconds. Extend your fingers back to the starting position, stretching every joint fully. Repeat this exercise 5-10 times with each hand. Finger spread Place your hand flat on a table with your palm facing down. Make sure your wrist stays straight as you do this exercise. Spread your fingers and thumb apart from each other as far as you can until you feel a gentle stretch. Hold this position for __________ seconds. Bring your fingers and thumb tight together again. Hold this position for __________ seconds. Repeat this exercise 5-10 times with each hand. Making circles Stand or sit with your arm, hand, and all five fingers pointed straight up. Make sure to keep your   wrist straight during the exercise. Make a circle by touching the tip of your thumb to the tip of your index finger. Hold for __________ seconds. Then open your hand wide. Repeat this motion with your thumb and each finger on your hand. Repeat this exercise 5-10 times with each hand. Thumb motion Sit  with your forearm resting on a table and your wrist straight. Your thumb should be facing up toward the ceiling. Keep your fingers relaxed as you move your thumb. Lift your thumb up as high as you can toward the ceiling. Hold for __________ seconds. Bend your thumb across your palm as far as you can, reaching the tip of your thumb for the small finger (pinkie) side of your palm. Hold for __________ seconds. Repeat this exercise 5-10 times with each hand. Grip strengthening  Hold a stress ball or other soft ball in the middle of your hand. Slowly increase the pressure, squeezing the ball as much as you can without causing pain. Think of bringing the tips of your fingers into the middle of your palm. All of your finger joints should bend when doing this exercise. Hold your squeeze for __________ seconds, then relax. Repeat this exercise 5-10 times with each hand. Contact a health care provider if: Your hand pain or discomfort gets much worse when you do an exercise. Your hand pain or discomfort does not improve within 2 hours after you exercise. If you have any of these problems, stop doing these exercises right away. Do not do them again unless your health care provider says that you can. Get help right away if: You develop sudden, severe hand pain or swelling. If this happens, stop doing these exercises right away. Do not do them again unless your health care provider says that you can. This information is not intended to replace advice given to you by your health care provider. Make sure you discuss any questions you have with your healthcare provider. Document Revised: 05/14/2018 Document Reviewed: 01/22/2018 Elsevier Patient Education  2022 [employer] for Nurse [employer]itioners, 15(4), [phone]. Retrieved November 10, 2017 from http://clinicalkey.com/nursing">  Knee Exercises Ask your health care provider which exercises are safe for you. Do exercises exactly as told by your health care  provider and adjust them as directed. It is normal to feel mild stretching, pulling, tightness, or discomfort as you do these exercises. Stop right away if you feel sudden pain or your pain gets worse. Do not begin these exercises until told by your health care provider. Stretching and range-of-motion exercises These exercises warm up your muscles and joints and improve the movement and flexibility of your knee. These exercises also help to relieve pain andswelling. Knee extension, prone Lie on your abdomen (prone position) on a bed. Place your left / right knee just beyond the edge of the surface so your knee is not on the bed. You can put a towel under your left / right thigh just above your kneecap for comfort. Relax your leg muscles and allow gravity to straighten your knee (extension). You should feel a stretch behind your left / right knee. Hold this position for __________ seconds. Scoot up so your knee is supported between repetitions. Repeat __________ times. Complete this exercise __________ times a day. Knee flexion, active  Lie on your back with both legs straight. If this causes back discomfort, bend your left / right knee so your foot is flat on the floor. Slowly slide your left / right heel back toward your buttocks.  Stop when you feel a gentle stretch in the front of your knee or thigh (flexion). Hold this position for __________ seconds. Slowly slide your left / right heel back to the starting position. Repeat __________ times. Complete this exercise __________ times a day. Quadriceps stretch, prone  Lie on your abdomen on a firm surface, such as a bed or padded floor. Bend your left / right knee and hold your ankle. If you cannot reach your ankle or pant leg, loop a belt around your foot and grab the belt instead. Gently pull your heel toward your buttocks. Your knee should not slide out to the side. You should feel a stretch in the front of your thigh and knee  (quadriceps). Hold this position for __________ seconds. Repeat __________ times. Complete this exercise __________ times a day. Hamstring, supine Lie on your back (supine position). Loop a belt or towel over the ball of your left / right foot. The ball of your foot is on the walking surface, right under your toes. Straighten your left / right knee and slowly pull on the belt to raise your leg until you feel a gentle stretch behind your knee (hamstring). Do not let your knee bend while you do this. Keep your other leg flat on the floor. Hold this position for __________ seconds. Repeat __________ times. Complete this exercise __________ times a day. Strengthening exercises These exercises build strength and endurance in your knee. Endurance is theability to use your muscles for a long time, even after they get tired. Quadriceps, isometric This exercise stretches the muscles in front of your thigh (quadriceps) without moving your knee joint (isometric). Lie on your back with your left / right leg extended and your other knee bent. Put a rolled towel or small pillow under your knee if told by your health care provider. Slowly tense the muscles in the front of your left / right thigh. You should see your kneecap slide up toward your hip or see increased dimpling just above the knee. This motion will push the back of the knee toward the floor. For __________ seconds, hold the muscle as tight as you can without increasing your pain. Relax the muscles slowly and completely. Repeat __________ times. Complete this exercise __________ times a day. Straight leg raises This exercise stretches the muscles in front of your thigh (quadriceps) and the muscles that move your hips (hip flexors). Lie on your back with your left / right leg extended and your other knee bent. Tense the muscles in the front of your left / right thigh. You should see your kneecap slide up or see increased dimpling just above the  knee. Your thigh may even shake a bit. Keep these muscles tight as you raise your leg 4-6 inches (10-15 cm) off the floor. Do not let your knee bend. Hold this position for __________ seconds. Keep these muscles tense as you lower your leg. Relax your muscles slowly and completely after each repetition. Repeat __________ times. Complete this exercise __________ times a day. Hamstring, isometric Lie on your back on a firm surface. Bend your left / right knee about __________ degrees. Dig your left / right heel into the surface as if you are trying to pull it toward your buttocks. Tighten the muscles in the back of your thighs (hamstring) to "dig" as hard as you can without increasing any pain. Hold this position for __________ seconds. Release the tension gradually and allow your muscles to relax completely for __________ seconds after  each repetition. Repeat __________ times. Complete this exercise __________ times a day. Hamstring curls If told by your health care provider, do this exercise while wearing ankle weights. Begin with __________ lb weights. Then increase the weight by 1 lb (0.5 kg) increments. Do not wear ankle weights that are more than __________ lb. Lie on your abdomen with your legs straight. Bend your left / right knee as far as you can without feeling pain. Keep your hips flat against the floor. Hold this position for __________ seconds. Slowly lower your leg to the starting position. Repeat __________ times. Complete this exercise __________ times a day. Squats This exercise strengthens the muscles in front of your thigh and knee (quadriceps). Stand in front of a table, with your feet and knees pointing straight ahead. You may rest your hands on the table for balance but not for support. Slowly bend your knees and lower your hips like you are going to sit in a chair. Keep your weight over your heels, not over your toes. Keep your lower legs upright so they are parallel  with the table legs. Do not let your hips go lower than your knees. Do not bend lower than told by your health care provider. If your knee pain increases, do not bend as low. Hold the squat position for __________ seconds. Slowly push with your legs to return to standing. Do not use your hands to pull yourself to standing. Repeat __________ times. Complete this exercise __________ times a day. Wall slides This exercise strengthens the muscles in front of your thigh and knee (quadriceps). Lean your back against a smooth wall or door, and walk your feet out 18-24 inches (46-61 cm) from it. Place your feet hip-width apart. Slowly slide down the wall or door until your knees bend __________ degrees. Keep your knees over your heels, not over your toes. Keep your knees in line with your hips. Hold this position for __________ seconds. Repeat __________ times. Complete this exercise __________ times a day. Straight leg raises This exercise strengthens the muscles that rotate the leg at the hip and move it away from your body (hip abductors). Lie on your side with your left / right leg in the top position. Lie so your head, shoulder, knee, and hip line up. You may bend your bottom knee to help you keep your balance. Roll your hips slightly forward so your hips are stacked directly over each other and your left / right knee is facing forward. Leading with your heel, lift your top leg 4-6 inches (10-15 cm). You should feel the muscles in your outer hip lifting. Do not let your foot drift forward. Do not let your knee roll toward the ceiling. Hold this position for __________ seconds. Slowly return your leg to the starting position. Let your muscles relax completely after each repetition. Repeat __________ times. Complete this exercise __________ times a day. Straight leg raises This exercise stretches the muscles that move your hips away from the front of the pelvis (hip extensors). Lie on your  abdomen on a firm surface. You can put a pillow under your hips if that is more comfortable. Tense the muscles in your buttocks and lift your left / right leg about 4-6 inches (10-15 cm). Keep your knee straight as you lift your leg. Hold this position for __________ seconds. Slowly lower your leg to the starting position. Let your leg relax completely after each repetition. Repeat __________ times. Complete this exercise __________ times a day. This  information is not intended to replace advice given to you by your health care provider. Make sure you discuss any questions you have with your healthcare provider. Document Revised: 11/11/2017 Document Reviewed: 11/11/2017 Elsevier Patient Education  2022 [employer].

## 2020-09-12 NOTE — Progress Notes (Signed)
Office Visit Note  Patient: Heather Raymond             Date of Birth: [birth year]           MRN: [id number]             PCP: Sharilyn Sites, MD Referring: Sharilyn Sites, MD Visit Date: 09/25/2020 Occupation: @GUAROCC @  Subjective:  Joint pain and stiffness   History of Present Illness: Heather Raymond is a 65 y.o. female with history of osteoarthritis and positive ANA.  She continues to have some stiffness in her hands and her knees.  She states she been doing stretching exercises which has been helpful.  She is also using topical Voltaren gel and using natural supplements.  Activities of Daily Living:  Patient reports morning stiffness for 0 minutes.   Patient Reports nocturnal pain.  Difficulty dressing/grooming: Denies Difficulty climbing stairs: Denies Difficulty getting out of chair: Denies Difficulty using hands for taps, buttons, cutlery, and/or writing: Denies  Review of Systems  Constitutional:  Negative for fatigue.  HENT:  Negative for mouth sores, mouth dryness and nose dryness.   Eyes:  Positive for dryness. Negative for pain and itching.  Respiratory:  Negative for shortness of breath and difficulty breathing.   Cardiovascular:  Negative for chest pain and palpitations.  Gastrointestinal:  Negative for blood in stool, constipation and diarrhea.  Endocrine: Negative for increased urination.  Genitourinary:  Negative for difficulty urinating.  Musculoskeletal:  Negative for joint pain, joint pain, joint swelling, myalgias, morning stiffness, muscle tenderness and myalgias.  Skin:  Negative for color change, rash and redness.  Allergic/Immunologic: Negative for susceptible to infections.  Neurological:  Negative for dizziness, numbness, headaches, memory loss and weakness.  Hematological:  Negative for bruising/bleeding tendency.  Psychiatric/Behavioral:  Negative for confusion.    PMFS History:  Patient Active Problem List   Diagnosis Date Noted   Diverticulosis  08/29/2020   Splenomegaly 06/17/2020   Heartburn 11/24/2019   Loose stools 11/24/2019   Diarrhea 09/10/2019   Abdominal pain 09/10/2019   Nausea without vomiting 09/10/2019   Moderate persistent asthma without complication 51/76/1607   Anaphylactic shock due to adverse food reaction 08/05/2019   Cystocele with prolapse 07/21/2018   Constipation 11/21/2017   Well woman exam with routine gynecological exam 03/17/2017   SUI (stress urinary incontinence, female) 02/17/2017   Female cystocele 01/02/2017   Post-menopausal atrophic vaginitis 01/02/2017   Perennial allergic rhinitis 01/09/2016   Adverse food reaction 01/09/2016   Mild persistent asthma, uncomplicated 37/11/6267   Abdominal pain, chronic, right upper quadrant 11/04/2013   Hx of adenomatous colonic polyps 11/04/2013   Encounter for colonoscopy due to history of adenomatous colonic polyps 06/01/2012    Past Medical History:  Diagnosis Date   Asthma    Hypertension    Hypothyroidism    PONV (postoperative nausea and vomiting)    Thyroid disease     Family History  Problem Relation Age of Onset   Allergic rhinitis Mother    Dementia Mother    Multiple myeloma Father        Deceased at age 33   Mental illness Sister        schizophrenia, Bipolar   Ankylosing spondylitis Sister    Eczema Sister    Hypertension Sister    Other Brother        broke neck in MVA   Arthritis Brother    Gallstones Brother    Cancer Maternal Grandmother  stomach   Gastric cancer Maternal Grandmother    Diabetes Maternal Grandfather    Heart disease Paternal Grandmother    Emphysema Paternal Grandfather    Obesity Daughter    Supraventricular tachycardia Daughter    Suicidality Son    Colon cancer Neg Hx    Angioedema Neg Hx    Asthma Neg Hx    Atopy Neg Hx    Immunodeficiency Neg Hx    Urticaria Neg Hx    Past Surgical History:  Procedure Laterality Date   ABDOMINAL HYSTERECTOMY     APPENDECTOMY     BUNIONECTOMY      March 2014   COLONOSCOPY [name] 06/30/2012   Dr.Rourk- adequate prep, normal rectum, scattered left-sided divertiucula 38m polyp in the ascending segment o/w the remainder of the colon appeared normal. bx=benign lymphoid polyp   COLONOSCOPY [name] 08/06/2017   Dr. RGala Romney diverticulosis. next tcs in 7 years.    CYSTOCELE REPAIR [name] 07/21/2018   Procedure: ANTERIOR REPAIR (CYSTOCELE) SLING CYSTOSCOPY;  Surgeon: MBjorn Loser MD;  Location: [facility];  Service: Urology;  Laterality: [name];   leocolonoscopy  04/29/2007   REML:JQGBEEFanal canal hemorrhoids/Left-sided diverticula/Flat polyp, mid sigmoid colon (hyperplastic), diminutive cecal polyp (tubular adenoma)   Social History   Social History Narrative   One child living. One son deceased secondary to accident.   Immunization History  Administered Date(s) Administered   MMarriottVaccination 02/25/2019, 04/02/2019, 01/31/2020     Objective: Vital Signs: BP (!) 149/96 (BP Location: Left Arm, Patient Position: Sitting, Cuff Size: Normal)   Pulse 76   Ht 5' 2.75" (1.594 m)   Wt 153 lb 6.4 oz (69.6 kg)   BMI 27.39 kg/m    Physical Exam Vitals and nursing note reviewed.  Constitutional:      Appearance: She is well-developed.  HENT:     Head: Normocephalic and atraumatic.  Eyes:     Conjunctiva/sclera: Conjunctivae normal.  Cardiovascular:     Rate and Rhythm: Normal rate and regular rhythm.     Heart sounds: Normal heart sounds.  Pulmonary:     Effort: Pulmonary effort is normal.     Breath sounds: Normal breath sounds.  Abdominal:     General: Bowel sounds are normal.     Palpations: Abdomen is soft.  Musculoskeletal:     Cervical back: Normal range of motion.  Lymphadenopathy:     Cervical: No cervical adenopathy.  Skin:    General: Skin is warm and dry.     Capillary Refill: Capillary refill takes less than 2 seconds.  Neurological:     Mental Status: She is alert and oriented to person, place, and time.   Psychiatric:        Behavior: Behavior normal.     Musculoskeletal Exam: C-spine was in good range of motion.  Shoulder joints, elbow joints, wrist joints with good range of motion.  She had mild PIP and DIP thickening.  Hip joints and knee joints with good range of motion.  She had crepitus in the bilateral knee joints without any warmth swelling or effusion.  CDAI Exam: CDAI Score: -- Patient Global: --; Provider Global: -- Swollen: --; Tender: -- Joint Exam 09/25/2020   No joint exam has been documented for this visit   There is currently no information documented on the homunculus. Go to the Rheumatology activity and complete the homunculus joint exam.  Investigation: No additional findings.  Imaging: XR Hand 2 View Left  Result Date: 08/29/2020 CMC, PIP and DIP  narrowing was noted.  No MCP, intercarpal or radiocarpal joint space narrowing was noted.  No erosive changes were noted. Impression: These findings are consistent with osteoarthritis of the hand.  XR Hand 2 View Right  Result Date: 08/29/2020 CMC, PIP and DIP narrowing was noted.  No MCP, intercarpal or radiocarpal joint space narrowing was noted.  No erosive changes were noted. Impression: These findings are consistent with osteoarthritis of the hand.   Recent Labs: Lab Results  Component Value Date   WBC 5.4 06/16/2020   HGB 14.5 06/16/2020   PLT 229 06/16/2020   NA 140 06/16/2020   K 4.1 06/16/2020   CL 108 06/16/2020   CO2 28 06/16/2020   GLUCOSE 92 06/16/2020   BUN 15 06/16/2020   CREATININE 0.64 06/16/2020   BILITOT 0.8 06/16/2020   ALKPHOS 74 06/16/2020   AST 33 06/16/2020   ALT 25 06/16/2020   PROT 7.2 06/16/2020   ALBUMIN 4.0 06/16/2020   CALCIUM 9.3 06/16/2020   GFRAA >60 07/22/2018   August 29, 2020 AVISE lupus index -0.7, ANA positive no titer, antihistone moderate positive, ENA negative, CB CAP negative, Jo 1 negative, anticardiolipin negative, beta-2 GP 1 negative, antiphosphatidylserine  negative, RF negative, anti-CCP negative, anticardiolipin negative, antithyroglobulin negative, anti-TPO negative   Speciality Comments: No specialty comments available.  Procedures:  No procedures performed Allergies: Other and Sulfa antibiotics   Assessment / Plan:     Visit Diagnoses: Primary osteoarthritis of both hands - Clinical and radiographic findings are consistent with osteoarthritis.  A handout on hand exercises was given at the last visit.  Use of arthritis gloves and Voltaren gel was discussed at the last visit.  She states exercises have been helpful.  She has been using natural supplements.  Chronic pain of left knee - Patient declined x-rays at the last visit.  A handout on exercises was given.  Hallux valgus (acquired), right foot - Asymptomatic.  Shoes without support were discussed.  History of bunionectomy of left great toe  Positive ANA (antinuclear antibody) - ANA titer negative, ENA negative, CB CAP negative.  Labs were discussed at length.  She has no clinical features of autoimmune disease.  Diverticulosis  Hx of adenomatous colonic polyps  Mild intermittent asthma without complication  Cystocele with prolapse  Anaphylactic shock due to food, subsequent encounter  Family history of ankylosing spondylitis  Orders: No orders of the defined types were placed in this encounter.  No orders of the defined types were placed in this encounter.    Follow-Up Instructions: Return if symptoms worsen or fail to improve, for Osteoarthritis.   Bo Merino, MD  Note - This record has been created using Editor, commissioning.  Chart creation errors have been sought, but may not always  have been located. Such creation errors do not reflect on  the standard of medical care.

## 2020-09-25 ENCOUNTER — Encounter: Payer: Self-pay | Admitting: Rheumatology

## 2020-09-25 ENCOUNTER — Other Ambulatory Visit: Payer: Self-pay

## 2020-09-25 ENCOUNTER — Ambulatory Visit: Payer: PPO | Admitting: Rheumatology

## 2020-09-25 VITALS — BP 149/96 | HR 76 | Ht 62.75 in | Wt 153.4 lb

## 2020-09-25 DIAGNOSIS — G8929 Other chronic pain: Secondary | ICD-10-CM

## 2020-09-25 DIAGNOSIS — M25562 Pain in left knee: Secondary | ICD-10-CM

## 2020-09-25 DIAGNOSIS — M2011 Hallux valgus (acquired), right foot: Secondary | ICD-10-CM

## 2020-09-25 DIAGNOSIS — Z8269 Family history of other diseases of the musculoskeletal system and connective tissue: Secondary | ICD-10-CM

## 2020-09-25 DIAGNOSIS — Z9889 Other specified postprocedural states: Secondary | ICD-10-CM | POA: Diagnosis not present

## 2020-09-25 DIAGNOSIS — M19042 Primary osteoarthritis, left hand: Secondary | ICD-10-CM | POA: Diagnosis not present

## 2020-09-25 DIAGNOSIS — Z86010 Personal history of colonic polyps: Secondary | ICD-10-CM

## 2020-09-25 DIAGNOSIS — N814 Uterovaginal prolapse, unspecified: Secondary | ICD-10-CM | POA: Diagnosis not present

## 2020-09-25 DIAGNOSIS — T7800XD Anaphylactic reaction due to unspecified food, subsequent encounter: Secondary | ICD-10-CM

## 2020-09-25 DIAGNOSIS — K5790 Diverticulosis of intestine, part unspecified, without perforation or abscess without bleeding: Secondary | ICD-10-CM | POA: Diagnosis not present

## 2020-09-25 DIAGNOSIS — J4520 Mild intermittent asthma, uncomplicated: Secondary | ICD-10-CM | POA: Diagnosis not present

## 2020-09-25 DIAGNOSIS — M19041 Primary osteoarthritis, right hand: Secondary | ICD-10-CM

## 2020-09-25 DIAGNOSIS — R768 Other specified abnormal immunological findings in serum: Secondary | ICD-10-CM | POA: Diagnosis not present

## 2020-10-31 ENCOUNTER — Telehealth: Payer: Self-pay

## 2020-10-31 NOTE — Telephone Encounter (Signed)
Spoke with patient and advised not to be concerned unless she receives a bill from the [facility]. Patient advised the EOB is usually more than what she will be billed for. Patient advised the bill should be no more than $45.

## 2020-10-31 NOTE — Telephone Encounter (Signed)
Patient called stating she received a letter from [facility] [place] stating the claim from [facility] for date of service 08/29/20 Code U0300 for $1,085 was denied as authorization to cover the services is required and not on file for non-participating providers.  Patient states she hasn't received the bill, but is worried she will because her insurance is denying the claim.  Patient requested a return call.

## 2020-12-05 DIAGNOSIS — Z0000 Encounter for general adult medical examination without abnormal findings: Secondary | ICD-10-CM | POA: Diagnosis not present

## 2020-12-05 DIAGNOSIS — I10 Essential (primary) hypertension: Secondary | ICD-10-CM | POA: Diagnosis not present

## 2020-12-05 DIAGNOSIS — Z6826 Body mass index (BMI) 26.0-26.9, adult: Secondary | ICD-10-CM | POA: Diagnosis not present

## 2020-12-05 DIAGNOSIS — Z23 Encounter for immunization: Secondary | ICD-10-CM | POA: Diagnosis not present

## 2020-12-05 DIAGNOSIS — E663 Overweight: Secondary | ICD-10-CM | POA: Diagnosis not present

## 2020-12-05 DIAGNOSIS — E063 Autoimmune thyroiditis: Secondary | ICD-10-CM | POA: Diagnosis not present

## 2020-12-05 DIAGNOSIS — Z1331 Encounter for screening for depression: Secondary | ICD-10-CM | POA: Diagnosis not present

## 2020-12-18 DIAGNOSIS — Z20822 Contact with and (suspected) exposure to covid-19: Secondary | ICD-10-CM | POA: Diagnosis not present

## 2020-12-20 DIAGNOSIS — U071 COVID-19: Secondary | ICD-10-CM | POA: Diagnosis not present

## 2021-02-26 DIAGNOSIS — E663 Overweight: Secondary | ICD-10-CM | POA: Diagnosis not present

## 2021-02-26 DIAGNOSIS — Z6827 Body mass index (BMI) 27.0-27.9, adult: Secondary | ICD-10-CM | POA: Diagnosis not present

## 2021-02-26 DIAGNOSIS — N390 Urinary tract infection, site not specified: Secondary | ICD-10-CM | POA: Diagnosis not present

## 2021-05-22 DIAGNOSIS — Z1231 Encounter for screening mammogram for malignant neoplasm of breast: Secondary | ICD-10-CM | POA: Diagnosis not present

## 2021-07-06 DIAGNOSIS — N3946 Mixed incontinence: Secondary | ICD-10-CM | POA: Diagnosis not present

## 2021-11-19 IMAGING — US US ABDOMEN COMPLETE
1 series · 14 of 25 positions shown · non-contrast
Comparison: None.

CLINICAL DATA: Intermittent right upper quadrant pain.

EXAM:
ABDOMEN ULTRASOUND COMPLETE

[Series 1: us abdomen complete · 14 of 109 slices shown]
[im 1/109]
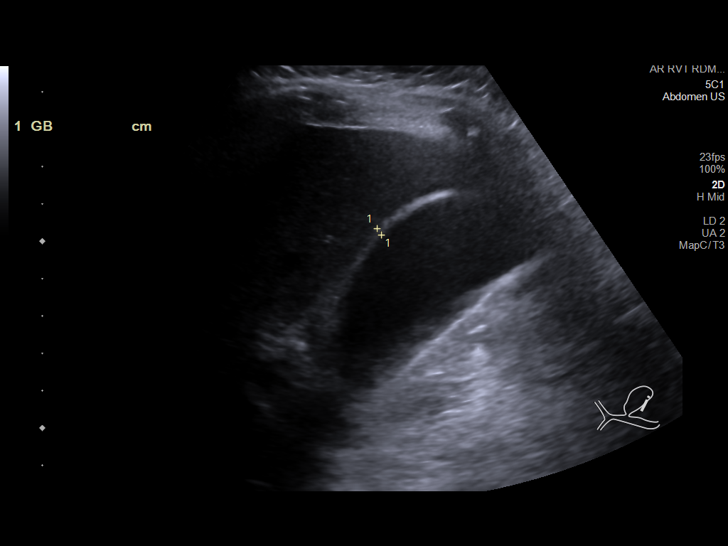
[im 10/109]
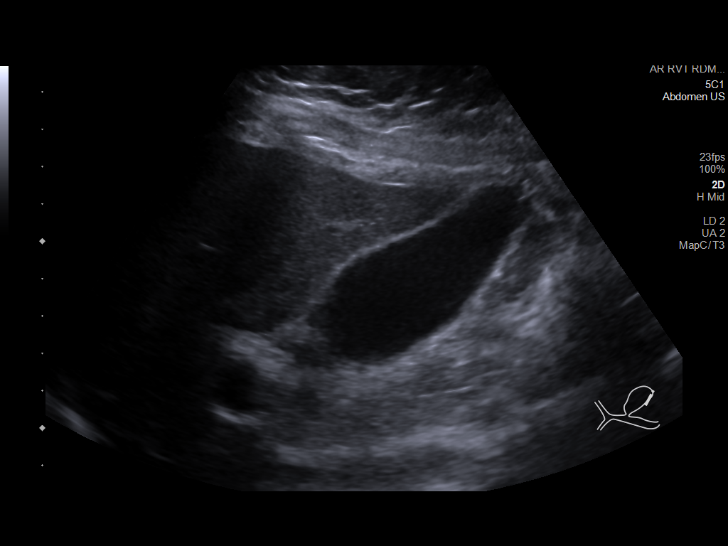
[im 19/109]
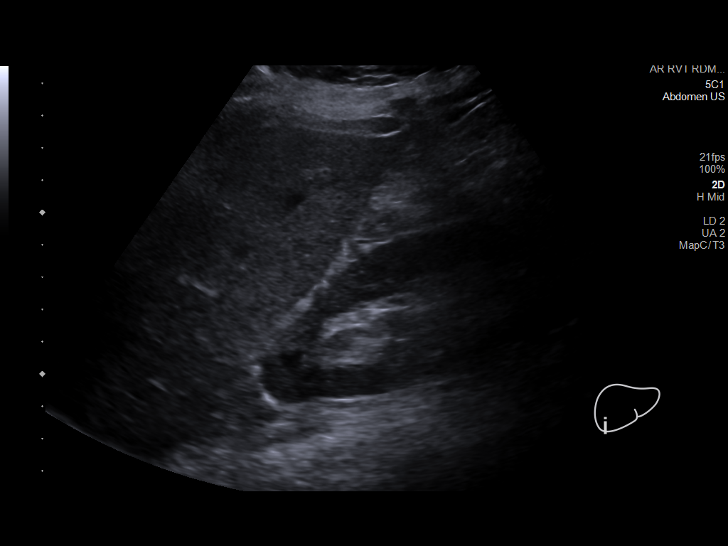
[im 28/109]
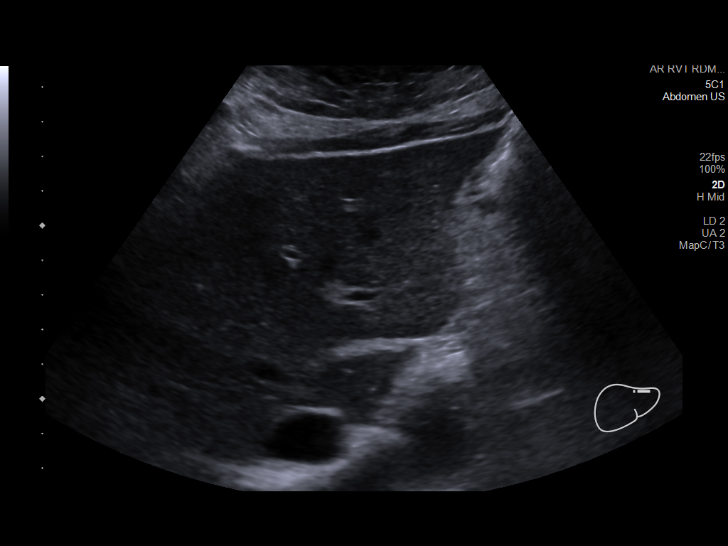
[im 37/109]
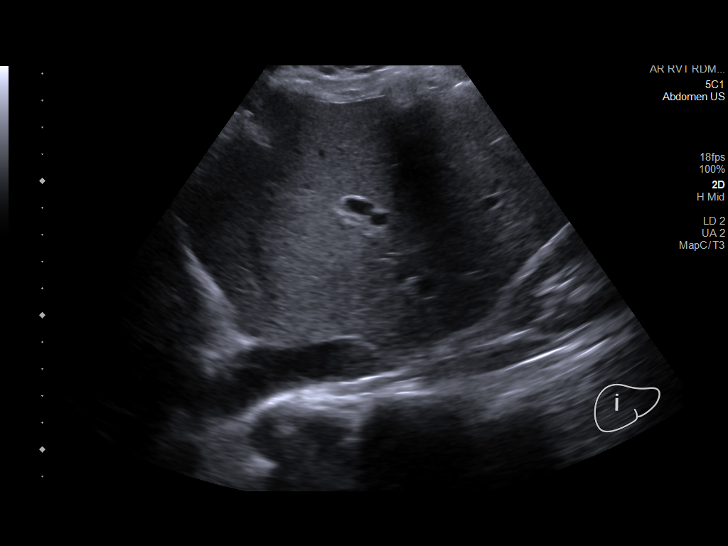
[im 41/109]
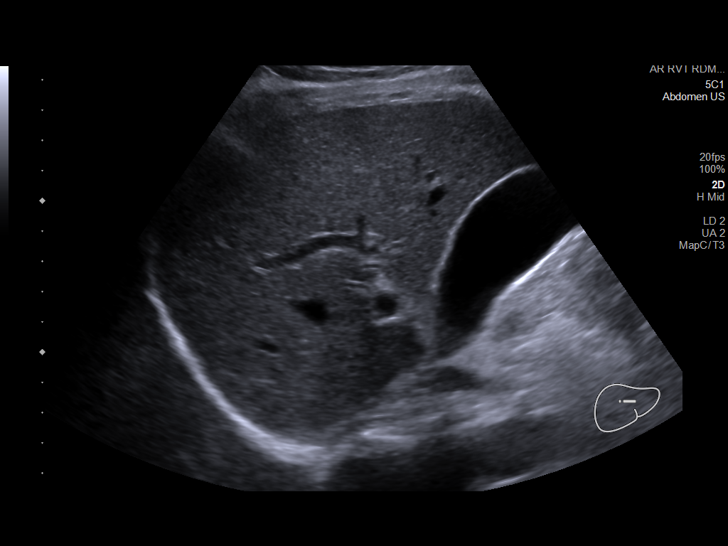
[im 50/109]
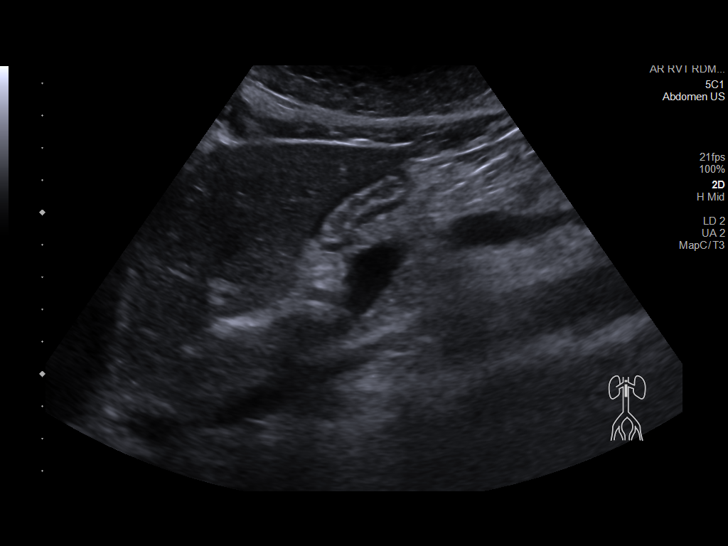
[im 59/109]
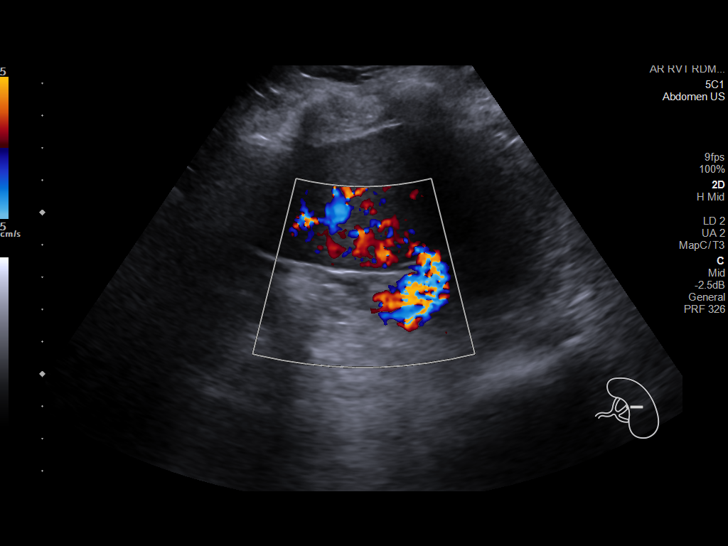
[im 68/109]
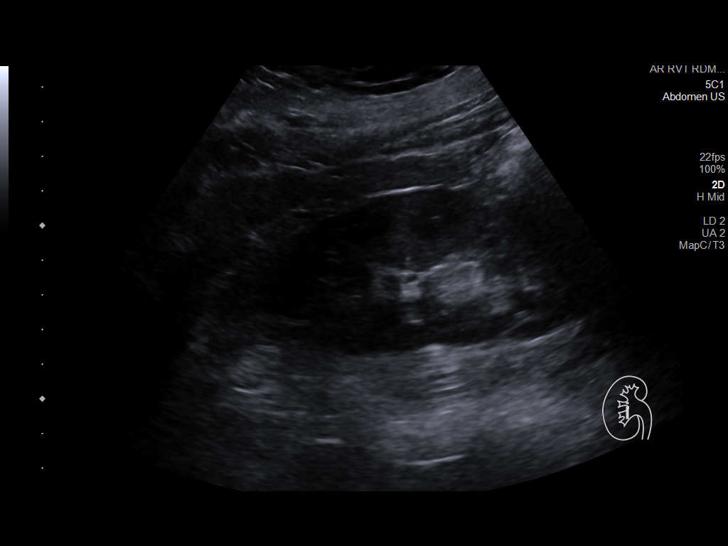
[im 73/109]
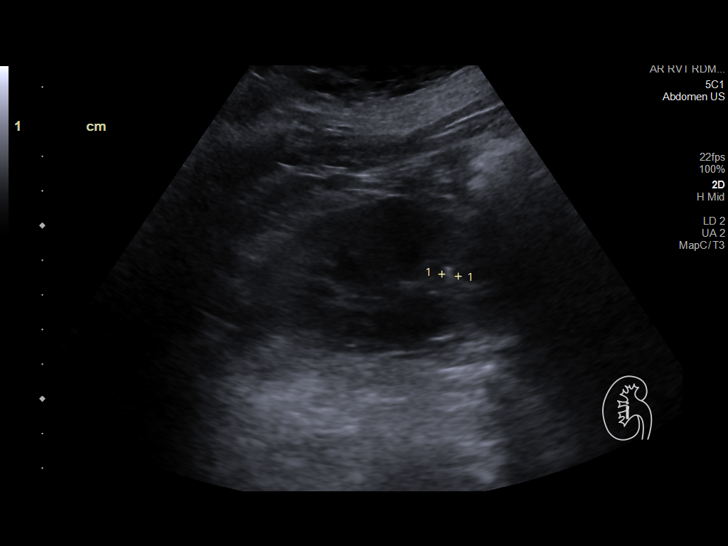
[im 82/109]
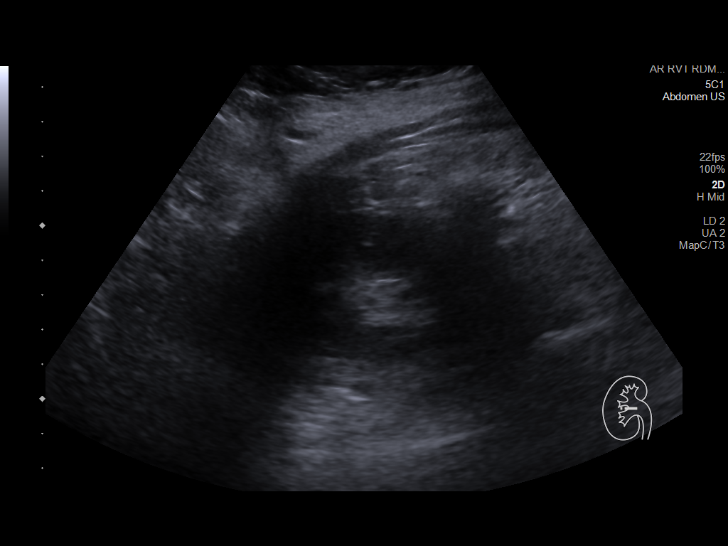
[im 91/109]
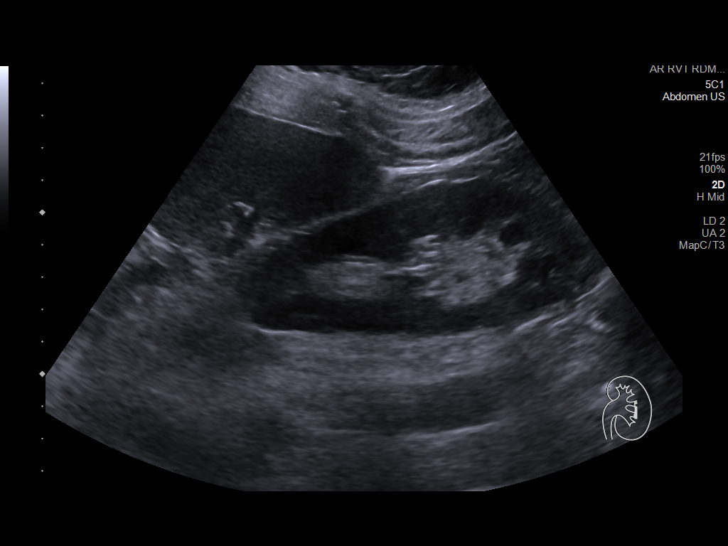
[im 100/109]
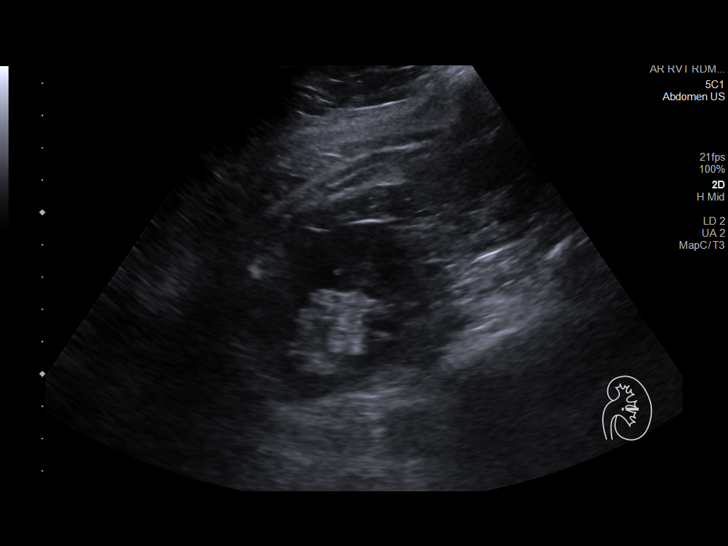
[im 109/109]
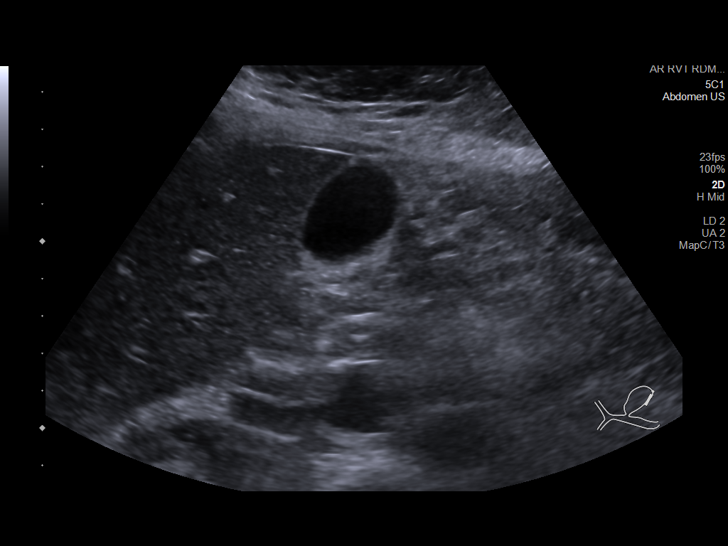

[14 of 25 positions shown; findings below may reference images not displayed]

FINDINGS: Gallbladder: No gallstones or wall thickening visualized (2.1 mm).
No sonographic Murphy sign noted by sonographer.

Common bile duct: Diameter: 3.8 mm

Liver: No focal lesion identified. Within normal limits in
parenchymal echogenicity. Portal vein is patent on color Doppler
imaging with normal direction of blood flow towards the liver.

IVC: No abnormality visualized.

Pancreas: Visualized portion unremarkable.

Spleen: The spleen measures 13.2 cm in length. The splenic
parenchyma is normal in echogenicity.

Right Kidney: Length: 10.9 cm. Echogenicity within normal limits. No
mass is visualized. A 6 mm shadowing echogenic focus is seen within
the right kidney. Mild right-sided hydronephrosis is also noted.

Left Kidney: Length: 11.1 cm. Echogenicity within normal limits. No
mass or hydronephrosis visualized.

Abdominal aorta: No aneurysm visualized (2.7 cm in AP diameter).

Other findings: None.
IMPRESSION: 1. Splenomegaly.
2. Mild right-sided hydronephrosis with a subcentimeter
nonobstructing renal stone within the right kidney.

## 2021-12-14 DIAGNOSIS — Z23 Encounter for immunization: Secondary | ICD-10-CM | POA: Diagnosis not present

## 2022-01-01 DIAGNOSIS — Z1331 Encounter for screening for depression: Secondary | ICD-10-CM | POA: Diagnosis not present

## 2022-01-01 DIAGNOSIS — E063 Autoimmune thyroiditis: Secondary | ICD-10-CM | POA: Diagnosis not present

## 2022-01-01 DIAGNOSIS — E663 Overweight: Secondary | ICD-10-CM | POA: Diagnosis not present

## 2022-01-01 DIAGNOSIS — Z0001 Encounter for general adult medical examination with abnormal findings: Secondary | ICD-10-CM | POA: Diagnosis not present

## 2022-01-01 DIAGNOSIS — Z6827 Body mass index (BMI) 27.0-27.9, adult: Secondary | ICD-10-CM | POA: Diagnosis not present

## 2022-01-01 DIAGNOSIS — I10 Essential (primary) hypertension: Secondary | ICD-10-CM | POA: Diagnosis not present

## 2022-01-15 DIAGNOSIS — U071 COVID-19: Secondary | ICD-10-CM | POA: Diagnosis not present

## 2022-02-17 IMAGING — CT CT ABD-PELV W/O CM
2 of 4 series · 16 of 46 positions shown, 18 images · non-contrast
Comparison: 05/26/2020, 06/01/2018

CLINICAL DATA: Splenomegaly, history of nephrolithiasis

EXAM:
CT ABDOMEN AND PELVIS WITHOUT CONTRAST
TECHNIQUE: Multidetector CT imaging of the abdomen and pelvis was performed
following the standard protocol without IV contrast.

[Series 2: axial st · axial · 0.74mm/px · z∈[+979,+1364]mm · 13 of 89 slices shown, 15 images]
[im 6/89  soft-tissue]
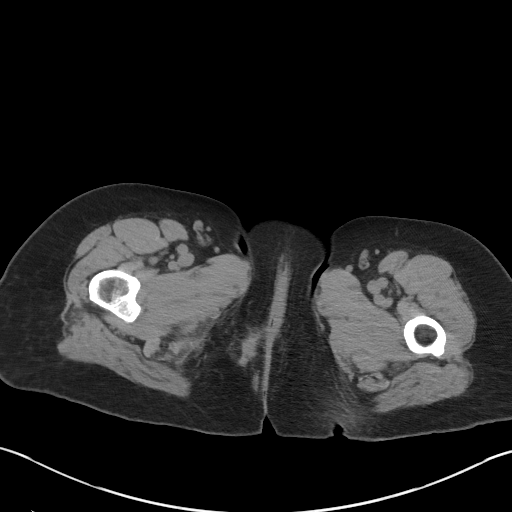
[im 6/89  bone]
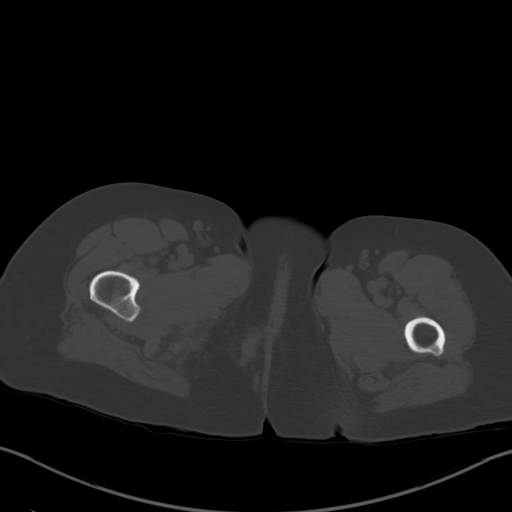
[im 12/89  soft-tissue]
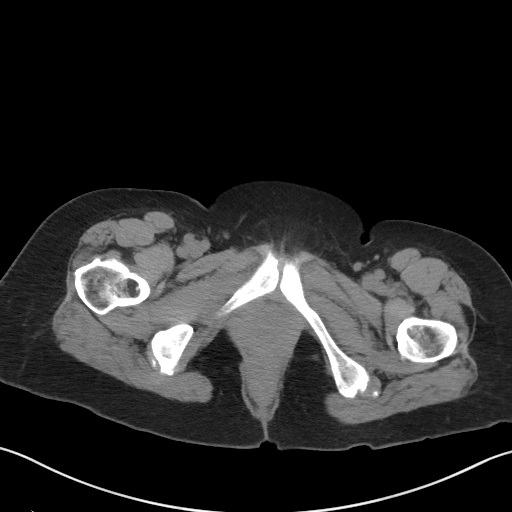
[im 18/89  soft-tissue]
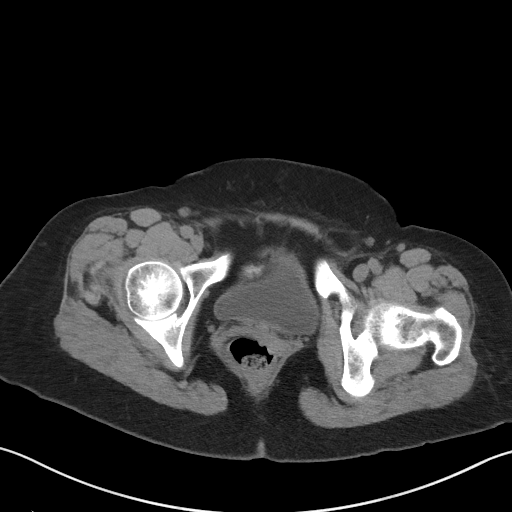
[im 24/89  soft-tissue]
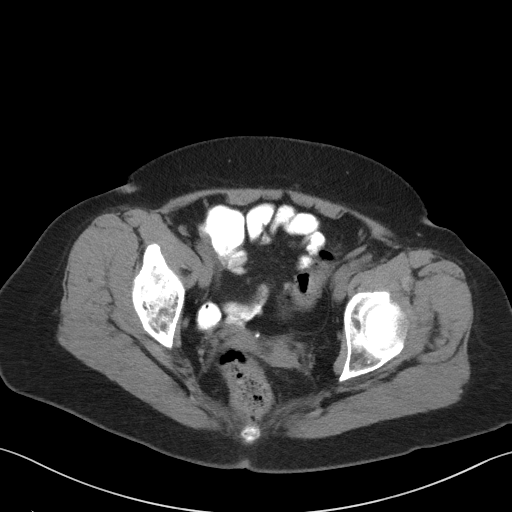
[im 30/89  soft-tissue]
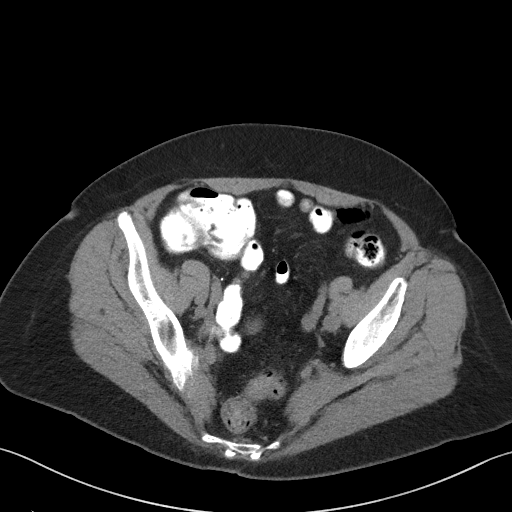
[im 36/89  soft-tissue]
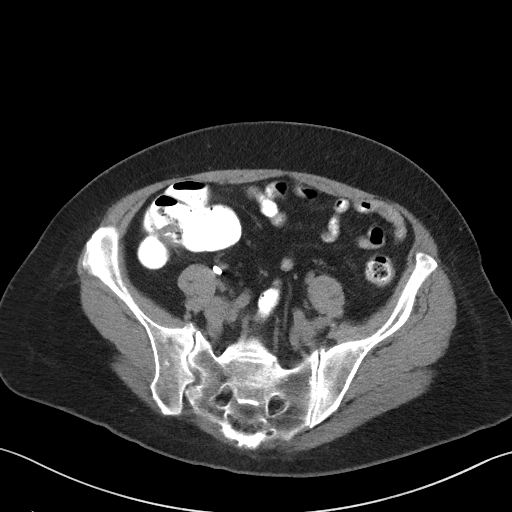
[im 47/89  soft-tissue]
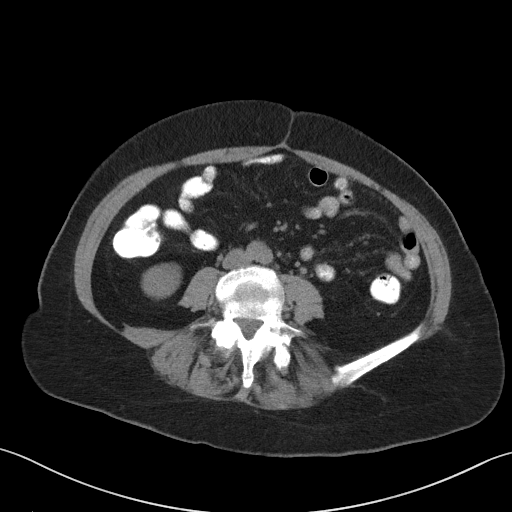
[im 53/89  soft-tissue]
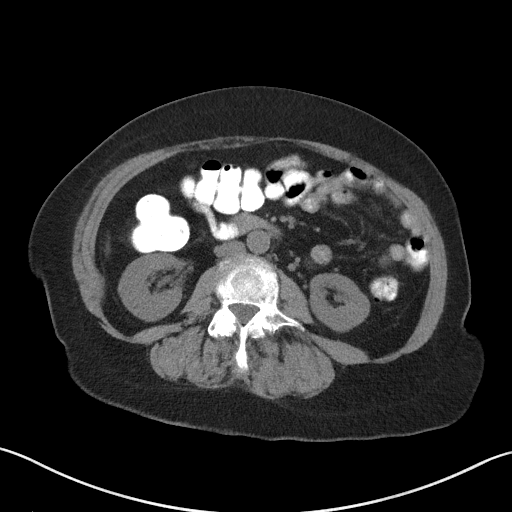
[im 59/89  soft-tissue]
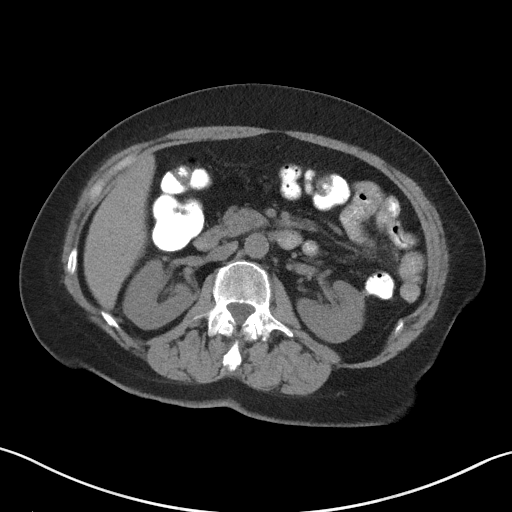
[im 59/89  bone]
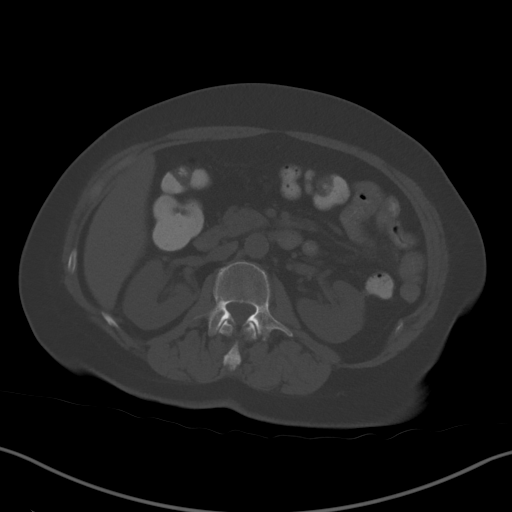
[im 65/89  soft-tissue]
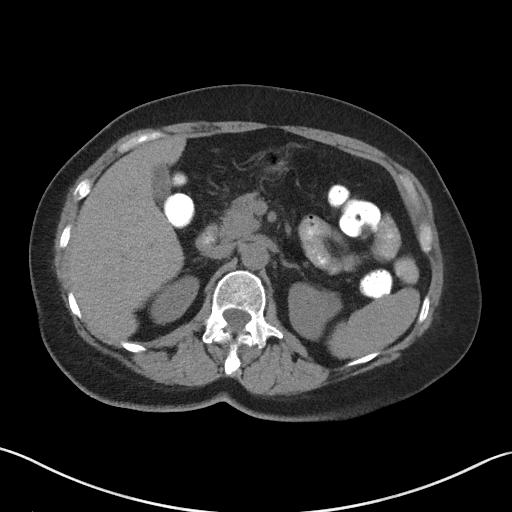
[im 71/89  soft-tissue]
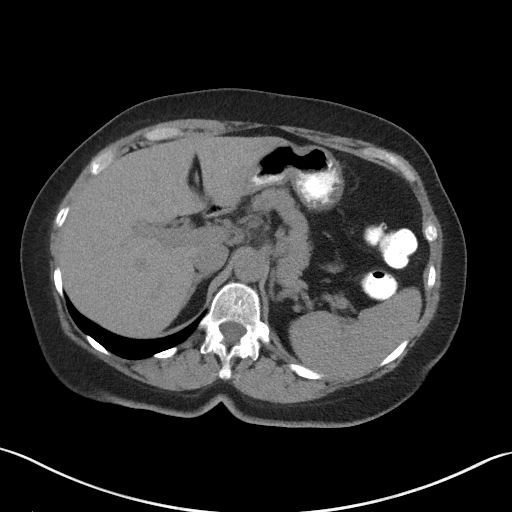
[im 77/89  soft-tissue]
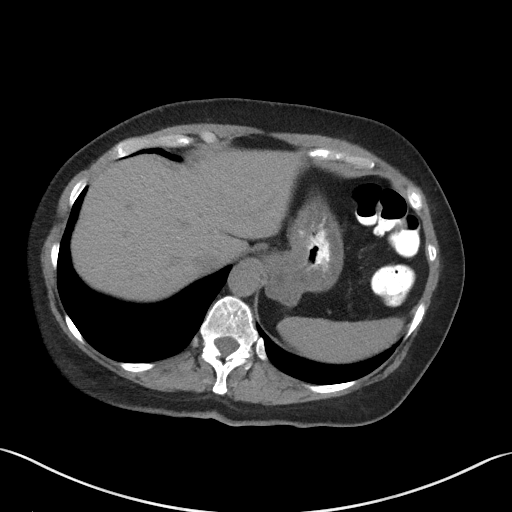
[im 83/89  soft-tissue]
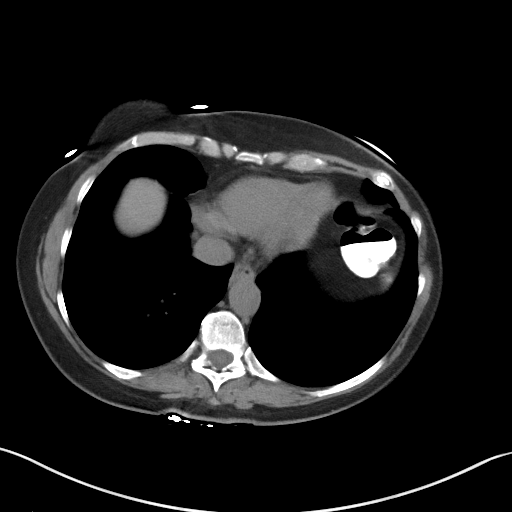

[Series 5: coronal st · coronal · 0.82mm/px · 3 of 102 slices shown]
[im 34/102  soft-tissue]
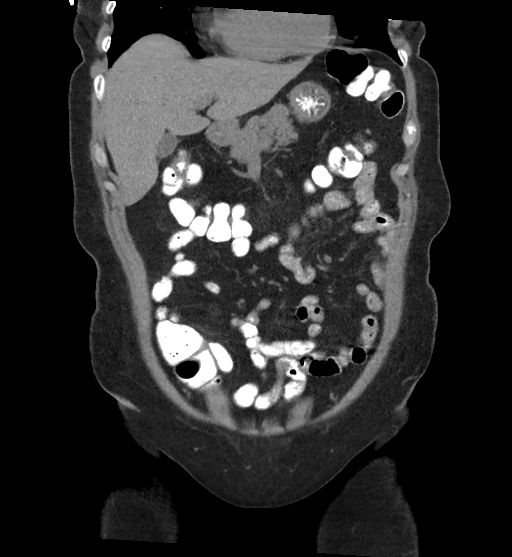
[im 45/102  soft-tissue]
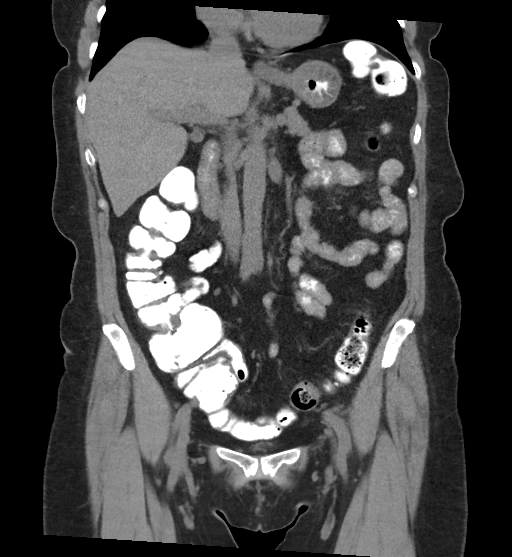
[im 57/102  soft-tissue]
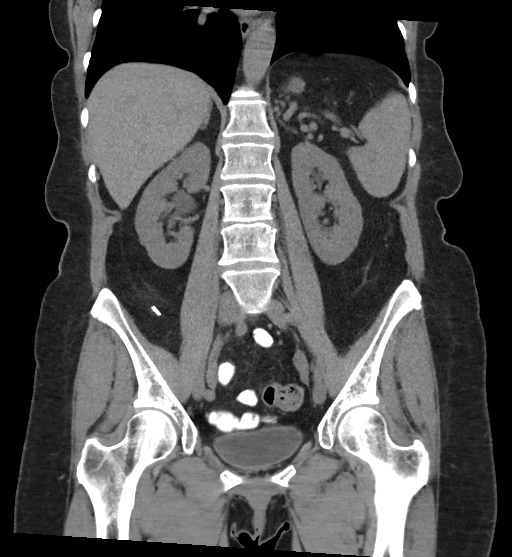

[16 of 46 positions shown; findings below may reference images not displayed]

FINDINGS: Lower chest: No acute abnormality.

Hepatobiliary: Limited without IV contrast. No large focal hepatic
abnormality or intrahepatic biliary dilatation. Gallbladder
nondistended. Common bile duct nondilated.

Pancreas: Unremarkable. No pancreatic ductal dilatation or
surrounding inflammatory changes.

Spleen: Normal in size without focal abnormality. Spleen measures
10.3 cm in craniocaudal length.

Adrenals/Urinary Tract: Normal adrenal glands. Right kidney has a
similar extrarenal pelvis. No renal obstruction or hydronephrosis.
No obstructing urinary tract or ureteral calculus. Bladder
unremarkable.

Stomach/Bowel: Negative for bowel obstruction, significant
dilatation, ileus, or free air. Remote appendectomy clips noted.
Scattered minor colonic diverticulosis. No acute inflammatory
process. No free fluid, fluid collection, hemorrhage, hematoma,
abscess or ascites.

Vascular/Lymphatic: Limited without IV contrast. Negative for
aneurysm. No retroperitoneal hemorrhage or hematoma. No bulky
adenopathy.

Reproductive: Status post hysterectomy. No adnexal masses.

Other: No abdominal wall hernia or abnormality. No abdominopelvic
ascites.

Musculoskeletal: Degenerative changes noted of the lumbar spine,
facet joints and SI joints. No acute osseous finding.
IMPRESSION: No acute finding by noncontrast CT.  Spleen is normal in size.

Remote appendectomy and hysterectomy

Scattered colonic diverticulosis without acute inflammatory process

## 2022-03-05 DIAGNOSIS — Z23 Encounter for immunization: Secondary | ICD-10-CM | POA: Diagnosis not present

## 2022-05-24 DIAGNOSIS — Z1231 Encounter for screening mammogram for malignant neoplasm of breast: Secondary | ICD-10-CM | POA: Diagnosis not present

## 2022-07-11 DIAGNOSIS — N390 Urinary tract infection, site not specified: Secondary | ICD-10-CM | POA: Diagnosis not present

## 2022-07-11 DIAGNOSIS — N3946 Mixed incontinence: Secondary | ICD-10-CM | POA: Diagnosis not present

## 2022-08-20 ENCOUNTER — Ambulatory Visit: Payer: PPO | Admitting: Internal Medicine

## 2022-08-20 ENCOUNTER — Encounter: Payer: Self-pay | Admitting: Internal Medicine

## 2022-08-20 VITALS — BP 124/79 | HR 73 | Temp 97.9°F | Ht 62.5 in | Wt 158.0 lb

## 2022-08-20 DIAGNOSIS — R140 Abdominal distension (gaseous): Secondary | ICD-10-CM | POA: Diagnosis not present

## 2022-08-20 DIAGNOSIS — Z86010 Personal history of colonic polyps: Secondary | ICD-10-CM | POA: Diagnosis not present

## 2022-08-20 DIAGNOSIS — K5909 Other constipation: Secondary | ICD-10-CM | POA: Diagnosis not present

## 2022-08-20 DIAGNOSIS — R195 Other fecal abnormalities: Secondary | ICD-10-CM | POA: Diagnosis not present

## 2022-08-20 MED ORDER — PANTOPRAZOLE SODIUM 40 MG PO TBEC: 40.0000 mg | DELAYED_RELEASE_TABLET | Freq: Every day | ORAL | 11 refills | Status: DC

## 2022-08-20 MED ORDER — HYOSCYAMINE SULFATE 0.125 MG SL SUBL: 0.1250 mg | SUBLINGUAL_TABLET | Freq: Three times a day (TID) | SUBLINGUAL | 5 refills | Status: DC

## 2022-08-20 NOTE — Patient Instructions (Signed)
It was good to see you again today!  As discussed, you have a multitude of symptoms that do not fit exactly under 1 diagnostic category.  For now, we should treat empirically for reflux.  Start Protonix 40 mg pill 30 minutes before breakfast daily (dispense 30 with 11 refills).  For episodes of abdominal cramping and bowel urgency, we will try Levsin 0.125 mg sublingual tablets.  Take 1 under the tongue before meals and at bedtime as needed for these symptoms.  Dispense 40 with 5 refills)  Blood work for celiac screen.  Total serum IgA level and TTG IgA level.  Office visit here in 8 weeks.

## 2022-08-20 NOTE — Progress Notes (Signed)
Primary Care Physician:  Assunta Found, MD Primary Gastroenterologist:  Dr.   Pre-Procedure History & Physical: HPI:  Heather Raymond is a 67 y.o. female here for further evaluation of recurrent intermittent abdominal bloating belching and postprandial diarrhea.  She had symptoms for about a week and they settle down -  then she has recurrence for about a week and they settle down.  Looking back, symptoms go back to good decade.  Weight has been stable going back 5 years.  No blood per rectum.  History of adenomas removed from her colon; due for surveillance in 7 years.  Has had any nausea or vomiting no dysphagia.  No typical major reflux symptoms.  When she has flare she Does have a component of right upper quadrant abdominal pain.  She had multiple gallbladder ultrasounds all negative.  Positive family history of gallstone disease.  Her last ultrasound was 2 years ago.  She has a history of nonobstructing kidney stones.  She has had a bladder tack in the past. He takes a probiotic daily.  She has never had an EGD.  Past Medical History:  Diagnosis Date   Asthma    Hypertension    Hypothyroidism    PONV (postoperative nausea and vomiting)    Thyroid disease     Past Surgical History:  Procedure Laterality Date   ABDOMINAL HYSTERECTOMY     APPENDECTOMY     BUNIONECTOMY     March 2014   COLONOSCOPY [name] 06/30/2012   Dr.[place] Lapaglia- adequate prep, normal rectum, scattered left-sided divertiucula 4mm polyp in the ascending segment o/w the remainder of the colon appeared normal. bx=benign lymphoid polyp   COLONOSCOPY [name] 08/06/2017   Dr. Jena Gauss: diverticulosis. next tcs in 7 years.    CYSTOCELE REPAIR [name] 07/21/2018   Procedure: ANTERIOR REPAIR (CYSTOCELE) SLING CYSTOSCOPY;  Surgeon: Alfredo Martinez, MD;  Location: [facility];  Service: Urology;  Laterality: [name];   leocolonoscopy  04/29/2007   [id number] anal canal hemorrhoids/Left-sided diverticula/Flat polyp, mid sigmoid colon  (hyperplastic), diminutive cecal polyp (tubular adenoma)    Prior to Admission medications   Medication Sig Start Date End Date Taking? Authorizing Provider  albuterol (PROAIR HFA) 108 (90 Base) MCG/ACT inhaler Inhale 4 puffs into the lungs every 4 (four) hours as needed for wheezing or shortness of breath. 09/03/16  Yes Alfonse Spruce, MD  Artificial Tear Solution (SOOTHE XP OP) Place 1 drop into both eyes 2 (two) times daily as needed (for dry eyes).   Yes [provider]  Ascorbic Acid (VITAMIN C PO) Take by mouth daily.   Yes [provider]  calcium-vitamin D (OSCAL WITH D) 500-200 MG-UNIT tablet Take 1 tablet by mouth daily.   Yes [provider]  glucosamine-chondroitin 500-400 MG tablet Take 1 tablet by mouth daily.   Yes [provider]  levothyroxine (SYNTHROID, LEVOTHROID) 75 MCG tablet Take 75 mcg by mouth daily before breakfast.  11/08/15  Yes [provider]  loratadine (CLARITIN) 10 MG tablet Take 10 mg by mouth daily.   Yes [provider]  Multiple Vitamin (MULTIVITAMIN) tablet Take 1 tablet by mouth daily.   Yes [provider]  nitrofurantoin, macrocrystal-monohydrate, (MACROBID) 100 MG capsule Take 1 capsule (100 mg total) by mouth 2 (two) times daily. Patient taking differently: Take 100 mg by mouth daily. 08/13/18  Yes Bast, Traci A, NP  Probiotic Product (PROBIOTIC DAILY PO) Take by mouth daily.   Yes [provider]  telmisartan (MICARDIS) 80 MG tablet  Take 80 mg by mouth at bedtime.    Yes [provider]    Allergies as of 08/20/2022 - Review Complete 08/20/2022  Allergen Reaction Noted   Other Anaphylaxis 12/03/2016   Sulfa antibiotics Rash 12/17/2011    Family History  Problem Relation Age of Onset   Allergic rhinitis Mother    Dementia Mother    Multiple myeloma Father        Deceased at age 75   Mental illness Sister        schizophrenia, Bipolar   Ankylosing spondylitis  Sister    Eczema Sister    Hypertension Sister    Other Brother        broke neck in MVA   Arthritis Brother    Gallstones Brother    Cancer Maternal Grandmother        stomach   Gastric cancer Maternal Grandmother    Diabetes Maternal Grandfather    Heart disease Paternal Grandmother    Emphysema Paternal Grandfather    Obesity Daughter    Supraventricular tachycardia Daughter    Suicidality Son    Colon cancer Neg Hx    Angioedema Neg Hx    Asthma Neg Hx    Atopy Neg Hx    Immunodeficiency Neg Hx    Urticaria Neg Hx     Social History   Socioeconomic History   Marital status: Married    Spouse name: Not on file   Number of children: 2   Years of education: Not on file   Highest education level: Not on file  Occupational History   Occupation: Retired Materials engineer: RETIRED  Tobacco Use   Smoking status: Never   Smokeless tobacco: Never  Vaping Use   Vaping status: Never Used  Substance and Sexual Activity   Alcohol use: No   Drug use: No   Sexual activity: Yes    Birth control/protection: Surgical    Comment: hyst  Other Topics Concern   Not on file  Social History Narrative   One child living. One son deceased secondary to accident.   Social Determinants of Health   Financial Resource Strain: Low Risk  (06/15/2020)   Overall Financial Resource Strain (CARDIA)    Difficulty of Paying Living Expenses: Not hard at all  Food Insecurity: No Food Insecurity (06/15/2020)   Hunger Vital Sign    Worried About Running Out of Food in the Last Year: Never true    Ran Out of Food in the Last Year: Never true  Transportation Needs: No Transportation Needs (06/15/2020)   PRAPARE - Administrator, Civil Service (Medical): No    Lack of Transportation (Non-Medical): No  Physical Activity: Sufficiently Active (06/15/2020)   Exercise Vital Sign    Days of Exercise per Week: 5 days    Minutes of Exercise per Session: 60 min  Stress: No Stress  Concern Present (06/15/2020)   [employer] of Occupational Health - Occupational Stress Questionnaire    Feeling of Stress : Not at all  Social Connections: Moderately Integrated (06/15/2020)   Social Connection and Isolation Panel [NHANES]    Frequency of Communication with Friends and Family: More than three times a week    Frequency of Social Gatherings with Friends and Family: More than three times a week    Attends Religious Services: More than 4 times per year    Active Member of [employer] or Organizations: No    Attends Banker  Meetings: Never    Marital Status: Married  Catering manager Violence: Not At Risk (06/15/2020)   Humiliation, Afraid, Rape, and Kick questionnaire    Fear of Current or Ex-Partner: No    Emotionally Abused: No    Physically Abused: No    Sexually Abused: No    Review of Systems: See HPI, otherwise negative ROS  Physical Exam: BP 124/79 (BP Location: Right Arm, Patient Position: Sitting, Cuff Size: Normal)   Pulse 73   Temp 97.9 F (36.6 C) (Oral)   Ht 5' 2.5" (1.588 m)   Wt 158 lb (71.7 kg)   SpO2 98%   BMI 28.44 kg/m  General:   Alert,  Well-developed, well-nourished, pleasant and cooperative in NAD Heart:  Regular rate and rhythm; no murmurs, clicks, rubs,  or gallops. Abdomen: Non-distended, normal bowel sounds.  Soft and nontender without appreciable mass or hepatosplenomegaly.   Impression/Plan: 67 year old lady with episodic periods of belching bloating abdominal cramps and bowel urgency along with nonbloody diarrhea.  Has bouts that might last a week and then she has a week where she has no bowel symptoms whatsoever.  There is  right upper quadrant component.  No typical symptoms of reflux.  No alarm symptoms of bleeding.  She has had multiple sounds of her gallbladder along the way.  Describes having a HIDA about 20 years ago which was also normal (I do not have those records).  History of colonic adenoma; due for  surveillance colonoscopy in 2 years.  Sounds like patient may have an element of irritable bowel syndrome.  Although symptoms not typical for this entity either.  She may have more reflux than she perceives.  Recommendations:  As discussed, you have a multitude of symptoms that do not fit exactly under 1 diagnostic category.  For now, we should treat empirically for reflux.  Start Protonix 40 mg pill 30 minutes before breakfast daily (dispense 30 with 11 refills).  For episodes of abdominal cramping and bowel urgency, we will try Levsin 0.125 mg sublingual tablets.  Take 1 under the tongue before meals and at bedtime as needed for these symptoms.  Dispense 40 with 5 refills)  Blood work for celiac screen.  Total serum IgA level and TTG IgA level.  Office visit here in 8 weeks.                    Notice: This dictation was prepared with Dragon dictation along with smaller phrase technology. Any transcriptional errors that result from this process are unintentional and may not be corrected upon review.

## 2022-08-22 LAB — IGA: IgA/Immunoglobulin A, Serum: 118 mg/dL (ref 87–352)

## 2022-08-22 LAB — TISSUE TRANSGLUTAMINASE, IGA: Transglutaminase IgA: <2 U/mL (ref 0–3)

## 2022-09-17 ENCOUNTER — Other Ambulatory Visit (HOSPITAL_COMMUNITY): Payer: Self-pay

## 2022-09-17 ENCOUNTER — Other Ambulatory Visit: Payer: Self-pay

## 2022-09-18 ENCOUNTER — Other Ambulatory Visit: Payer: Self-pay

## 2022-09-18 ENCOUNTER — Other Ambulatory Visit (HOSPITAL_COMMUNITY): Payer: Self-pay

## 2022-09-18 MED ORDER — TELMISARTAN 80 MG PO TABS
80.0000 mg | ORAL_TABLET | Freq: Every day | ORAL | 3 refills | Status: DC
  Filled 2022-09-18: qty 90, 90d supply, fill #0
  Filled 2023-04-05: qty 90, 90d supply, fill #1
  Filled 2023-08-26: qty 90, 90d supply, fill #2

## 2022-09-18 MED ORDER — NITROFURANTOIN MONOHYD MACRO 100 MG PO CAPS
100.0000 mg | ORAL_CAPSULE | Freq: Every day | ORAL | 3 refills | Status: DC
  Filled 2022-09-18 – 2022-12-14 (×2): qty 90, 90d supply, fill #0

## 2022-09-18 MED ORDER — LEVOTHYROXINE SODIUM 75 MCG PO TABS
75.0000 ug | ORAL_TABLET | Freq: Every day | ORAL | 3 refills | Status: AC
  Filled 2022-09-18: qty 90, 90d supply, fill #0
  Filled 2022-12-14: qty 90, 90d supply, fill #1
  Filled 2023-07-03: qty 90, 90d supply, fill #2

## 2022-09-23 ENCOUNTER — Other Ambulatory Visit: Payer: Self-pay

## 2022-09-23 ENCOUNTER — Encounter (HOSPITAL_COMMUNITY): Payer: Self-pay

## 2022-09-23 ENCOUNTER — Other Ambulatory Visit (HOSPITAL_COMMUNITY): Payer: Self-pay

## 2022-09-23 MED ORDER — PANTOPRAZOLE SODIUM 40 MG PO TBEC
40.0000 mg | DELAYED_RELEASE_TABLET | Freq: Every day | ORAL | 11 refills | Status: DC
  Filled 2022-09-23 – 2022-12-14 (×3): qty 30, 30d supply, fill #0
  Filled 2023-02-08 – 2023-07-30 (×2): qty 30, 30d supply, fill #1
  Filled 2023-08-26: qty 30, 30d supply, fill #2

## 2022-09-23 MED ORDER — HYOSCYAMINE SULFATE 0.125 MG SL SUBL
0.1250 mg | SUBLINGUAL_TABLET | Freq: Three times a day (TID) | SUBLINGUAL | 5 refills | Status: AC
  Filled 2022-09-23: qty 40, 10d supply, fill #0

## 2022-09-24 ENCOUNTER — Encounter: Payer: Self-pay | Admitting: Internal Medicine

## 2022-09-26 ENCOUNTER — Other Ambulatory Visit: Payer: Self-pay

## 2022-10-04 DIAGNOSIS — I10 Essential (primary) hypertension: Secondary | ICD-10-CM | POA: Diagnosis not present

## 2022-10-04 DIAGNOSIS — U071 COVID-19: Secondary | ICD-10-CM | POA: Diagnosis not present

## 2022-11-26 ENCOUNTER — Ambulatory Visit: Payer: PPO | Admitting: Internal Medicine

## 2022-11-26 ENCOUNTER — Encounter: Payer: Self-pay | Admitting: Internal Medicine

## 2022-11-26 VITALS — BP 142/97 | HR 80 | Temp 97.9°F | Ht 62.5 in | Wt 156.2 lb

## 2022-11-26 DIAGNOSIS — R197 Diarrhea, unspecified: Secondary | ICD-10-CM

## 2022-11-26 DIAGNOSIS — Z860101 Personal history of adenomatous and serrated colon polyps: Secondary | ICD-10-CM

## 2022-11-26 DIAGNOSIS — R12 Heartburn: Secondary | ICD-10-CM

## 2022-11-26 DIAGNOSIS — R109 Unspecified abdominal pain: Secondary | ICD-10-CM | POA: Diagnosis not present

## 2022-11-26 DIAGNOSIS — K219 Gastro-esophageal reflux disease without esophagitis: Secondary | ICD-10-CM | POA: Diagnosis not present

## 2022-11-26 DIAGNOSIS — K589 Irritable bowel syndrome without diarrhea: Secondary | ICD-10-CM

## 2022-11-26 NOTE — Progress Notes (Unsigned)
Primary Care Physician:  Assunta Found, MD Primary Gastroenterologist:  Dr. Jena Gauss  Pre-Procedure History & Physical: HPI:  Heather Raymond is a 67 y.o. female here for follow-up of postprandial abdominal cramps, diarrhea and GERD.  Now on Protonix 40 mg daily and Levsin as needed.  Globally, much improved GI symptoms.  Very happy with Protonix and Levsin as needed; she does not have to worry about rushing to a bathroom after she eats.  History of colonic adenoma; due for surveillance colonoscopy 2026.  No alarm symptoms such as dysphagia or early satiety Celiac screen negative.  Stressed out now regarding highway bridge being [place] in front of her [place].  Past Medical History:  Diagnosis Date   Asthma    Hypertension    Hypothyroidism    PONV (postoperative nausea and vomiting)    Thyroid disease     Past Surgical History:  Procedure Laterality Date   ABDOMINAL HYSTERECTOMY     APPENDECTOMY     BUNIONECTOMY     March 2014   COLONOSCOPY [name] 06/30/2012   Dr.Jeliyah Middlebrooks- adequate prep, normal rectum, scattered left-sided divertiucula 4mm polyp in the ascending segment o/w the remainder of the colon appeared normal. bx=benign lymphoid polyp   COLONOSCOPY [name] 08/06/2017   Dr. Jena Gauss: diverticulosis. next tcs in 7 years.    CYSTOCELE REPAIR [name] 07/21/2018   Procedure: ANTERIOR REPAIR (CYSTOCELE) SLING CYSTOSCOPY;  Surgeon: Alfredo Martinez, MD;  Location: [facility];  Service: Urology;  Laterality: [name];   leocolonoscopy  04/29/2007   [id number] anal canal hemorrhoids/Left-sided diverticula/Flat polyp, mid sigmoid colon (hyperplastic), diminutive cecal polyp (tubular adenoma)    Prior to Admission medications   Medication Sig Start Date End Date Taking? Authorizing Provider  Artificial Tear Solution (SOOTHE XP OP) Place 1 drop into both eyes 2 (two) times daily as needed (for dry eyes).   Yes [provider]  Ascorbic Acid (VITAMIN C PO) Take by mouth daily.   Yes [provider]  calcium-vitamin D (OSCAL WITH D) 500-200 MG-UNIT tablet Take 1 tablet by mouth daily.   Yes [provider]  hyoscyamine (LEVSIN/SL) 0.125 MG SL tablet Place 1 tablet (0.125 mg total) under the tongue to dissolve 4 (four) times daily -  before meals and at bedtime. 09/23/22  Yes Lilliane Sposito, Gerrit Friends, MD  levothyroxine (SYNTHROID) 75 MCG tablet Take 1 tablet (75 mcg total) by mouth daily. 09/17/22  Yes   loratadine (CLARITIN) 10 MG tablet Take 10 mg by mouth daily.   Yes [provider]  Multiple Vitamin (MULTIVITAMIN) tablet Take 1 tablet by mouth daily.   Yes [provider]  nitrofurantoin, macrocrystal-monohydrate, (MACROBID) 100 MG capsule Take 1 capsule (100 mg total) by mouth daily. 09/17/22  Yes   pantoprazole (PROTONIX) 40 MG tablet Take 1 tablet (40 mg total) by mouth daily. 09/23/22  Yes Sherica Paternostro, Gerrit Friends, MD  telmisartan (MICARDIS) 80 MG tablet Take 1 tablet (80 mg total) by mouth daily. 09/17/22  Yes     Allergies as of 11/26/2022 - Review Complete 11/26/2022  Allergen Reaction Noted   Other Anaphylaxis 12/03/2016   Sulfa antibiotics Rash 12/17/2011    Family History  Problem Relation Age of Onset   Allergic rhinitis Mother    Dementia Mother    Multiple myeloma Father        Deceased at age 26   Mental illness Sister        schizophrenia, Bipolar   Ankylosing spondylitis Sister    Eczema Sister  Hypertension Sister    Other Brother        broke neck in MVA   Arthritis Brother    Gallstones Brother    Cancer Maternal Grandmother        stomach   Gastric cancer Maternal Grandmother    Diabetes Maternal Grandfather    Heart disease Paternal Grandmother    Emphysema Paternal Grandfather    Obesity Daughter    Supraventricular tachycardia Daughter    Suicidality Son    Colon cancer Neg Hx    Angioedema Neg Hx    Asthma Neg Hx    Atopy Neg Hx    Immunodeficiency Neg Hx    Urticaria Neg Hx     Social History   Socioeconomic  History   Marital status: Married    Spouse name: Not on file   Number of children: 2   Years of education: Not on file   Highest education level: Not on file  Occupational History   Occupation: Retired Materials engineer: RETIRED  Tobacco Use   Smoking status: Never   Smokeless tobacco: Never  Vaping Use   Vaping status: Never Used  Substance and Sexual Activity   Alcohol use: No   Drug use: No   Sexual activity: Yes    Birth control/protection: Surgical    Comment: hyst  Other Topics Concern   Not on file  Social History Narrative   One child living. One son deceased secondary to accident.   Social Determinants of Health   Financial Resource Strain: Low Risk  (06/15/2020)   Overall Financial Resource Strain (CARDIA)    Difficulty of Paying Living Expenses: Not hard at all  Food Insecurity: No Food Insecurity (06/15/2020)   Hunger Vital Sign    Worried About Running Out of Food in the Last Year: Never true    Ran Out of Food in the Last Year: Never true  Transportation Needs: No Transportation Needs (06/15/2020)   PRAPARE - Administrator, Civil Service (Medical): No    Lack of Transportation (Non-Medical): No  Physical Activity: Sufficiently Active (06/15/2020)   Exercise Vital Sign    Days of Exercise per Week: 5 days    Minutes of Exercise per Session: 60 min  Stress: No Stress Concern Present (06/15/2020)   [employer] of Occupational Health - Occupational Stress Questionnaire    Feeling of Stress : Not at all  Social Connections: Moderately Integrated (06/15/2020)   Social Connection and Isolation Panel [NHANES]    Frequency of Communication with Friends and Family: More than three times a week    Frequency of Social Gatherings with Friends and Family: More than three times a week    Attends Religious Services: More than 4 times per year    Active Member of [employer] or Organizations: No    Attends Banker Meetings: Never     Marital Status: Married  Catering manager Violence: Not At Risk (06/15/2020)   Humiliation, Afraid, Rape, and Kick questionnaire    Fear of Current or Ex-Partner: No    Emotionally Abused: No    Physically Abused: No    Sexually Abused: No    Review of Systems: See HPI, otherwise negative ROS  Physical Exam: BP (!) 142/97 (BP Location: Left Arm, Patient Position: Sitting, Cuff Size: Normal)   Pulse 80   Temp 97.9 F (36.6 C) (Oral)   Ht 5' 2.5" (1.588 m)   Wt 156 lb 3.2 oz (70.9  kg)   SpO2 97%   BMI 28.11 kg/m  General:   Alert,  Well-developed, well-nourished, pleasant and cooperative in NAD  Impression/Plan: 67 year old lady with uncomplicated GERD responsive to once daily PPI in the way of Protonix.  Postprandial abdominal cramps diarrhea improved with as needed use of Levsin.  Likely an element of GERD and IBS-D playing a role in her symptoms.  Globally, much much improved.  She is very happy.  Recommendations:  Continue Levsin sublingually on an as-needed basis. . Irritable bowel syndrome information provided today  Continue Protonix 40 mg daily best taken 30 minutes before meal  GERD information provided  Office visit in 6 months  Surveillance colonoscopy 2026.      Notice: This dictation was prepared with Dragon dictation along with smaller phrase technology. Any transcriptional errors that result from this process are unintentional and may not be corrected upon review.

## 2022-11-26 NOTE — Patient Instructions (Signed)
It was good to see you again today!  Continue Levsin sublingually on an as-needed basis for your symptoms which are due to irritable bowel syndrome  Irritable bowel syndrome information provided today  Continue Protonix 40 mg daily best taken 30 minutes before meal  GERD information provided  Office visit in 6 months  Surveillance colonoscopy 2026.

## 2022-12-14 ENCOUNTER — Other Ambulatory Visit (HOSPITAL_COMMUNITY): Payer: Self-pay

## 2022-12-28 DIAGNOSIS — E663 Overweight: Secondary | ICD-10-CM | POA: Diagnosis not present

## 2022-12-28 DIAGNOSIS — R030 Elevated blood-pressure reading, without diagnosis of hypertension: Secondary | ICD-10-CM | POA: Diagnosis not present

## 2022-12-28 DIAGNOSIS — Z6828 Body mass index (BMI) 28.0-28.9, adult: Secondary | ICD-10-CM | POA: Diagnosis not present

## 2022-12-28 DIAGNOSIS — R059 Cough, unspecified: Secondary | ICD-10-CM | POA: Diagnosis not present

## 2023-01-09 ENCOUNTER — Other Ambulatory Visit: Payer: Self-pay

## 2023-01-09 ENCOUNTER — Other Ambulatory Visit (HOSPITAL_COMMUNITY): Payer: Self-pay

## 2023-01-09 DIAGNOSIS — I10 Essential (primary) hypertension: Secondary | ICD-10-CM | POA: Diagnosis not present

## 2023-01-09 DIAGNOSIS — E663 Overweight: Secondary | ICD-10-CM | POA: Diagnosis not present

## 2023-01-09 DIAGNOSIS — Z0001 Encounter for general adult medical examination with abnormal findings: Secondary | ICD-10-CM | POA: Diagnosis not present

## 2023-01-09 DIAGNOSIS — Z6827 Body mass index (BMI) 27.0-27.9, adult: Secondary | ICD-10-CM | POA: Diagnosis not present

## 2023-01-09 DIAGNOSIS — Z1331 Encounter for screening for depression: Secondary | ICD-10-CM | POA: Diagnosis not present

## 2023-01-09 DIAGNOSIS — E063 Autoimmune thyroiditis: Secondary | ICD-10-CM | POA: Diagnosis not present

## 2023-01-09 MED ORDER — LEVOTHYROXINE SODIUM 75 MCG PO TABS: 75.0000 ug | ORAL_TABLET | Freq: Every day | ORAL | 3 refills | Status: DC

## 2023-01-09 MED ORDER — NITROFURANTOIN MONOHYD MACRO 100 MG PO CAPS
100.0000 mg | ORAL_CAPSULE | Freq: Every day | ORAL | 3 refills | Status: AC
  Filled 2023-01-09 – 2023-02-25 (×2): qty 90, 90d supply, fill #0
  Filled 2023-06-10: qty 90, 90d supply, fill #1

## 2023-01-09 MED ORDER — TELMISARTAN 80 MG PO TABS
80.0000 mg | ORAL_TABLET | Freq: Every day | ORAL | 3 refills | Status: DC
  Filled 2023-01-09: qty 90, 90d supply, fill #0

## 2023-01-09 MED ORDER — PANTOPRAZOLE SODIUM 40 MG PO TBEC
40.0000 mg | DELAYED_RELEASE_TABLET | Freq: Every day | ORAL | 3 refills | Status: DC
  Filled 2023-01-09: qty 90, 90d supply, fill #0
  Filled 2023-02-25 – 2023-04-30 (×3): qty 90, 90d supply, fill #1

## 2023-02-08 ENCOUNTER — Other Ambulatory Visit (HOSPITAL_COMMUNITY): Payer: Self-pay

## 2023-02-25 ENCOUNTER — Other Ambulatory Visit: Payer: Self-pay

## 2023-02-26 ENCOUNTER — Other Ambulatory Visit: Payer: Self-pay

## 2023-02-27 ENCOUNTER — Other Ambulatory Visit (HOSPITAL_COMMUNITY): Payer: Self-pay

## 2023-03-03 ENCOUNTER — Other Ambulatory Visit (HOSPITAL_COMMUNITY): Payer: Self-pay

## 2023-03-03 ENCOUNTER — Telehealth: Payer: Self-pay

## 2023-03-03 NOTE — Telephone Encounter (Signed)
Pt called requesting refills on her protonix. Pt was last seen on 11/26/2022.

## 2023-03-03 NOTE — Telephone Encounter (Signed)
Pt is also requesting refills on levsin

## 2023-03-14 ENCOUNTER — Other Ambulatory Visit (HOSPITAL_COMMUNITY): Payer: Self-pay

## 2023-04-07 ENCOUNTER — Other Ambulatory Visit (HOSPITAL_COMMUNITY): Payer: Self-pay

## 2023-04-30 ENCOUNTER — Other Ambulatory Visit (HOSPITAL_COMMUNITY): Payer: Self-pay

## 2023-05-01 ENCOUNTER — Encounter: Payer: Self-pay | Admitting: Internal Medicine

## 2023-05-27 DIAGNOSIS — Z1231 Encounter for screening mammogram for malignant neoplasm of breast: Secondary | ICD-10-CM | POA: Diagnosis not present

## 2023-06-10 ENCOUNTER — Encounter: Payer: Self-pay | Admitting: Internal Medicine

## 2023-06-10 ENCOUNTER — Other Ambulatory Visit: Payer: Self-pay

## 2023-06-10 ENCOUNTER — Ambulatory Visit (INDEPENDENT_AMBULATORY_CARE_PROVIDER_SITE_OTHER): Admitting: Internal Medicine

## 2023-06-10 VITALS — BP 131/85 | HR 79 | Temp 97.5°F | Ht 62.5 in | Wt 153.2 lb

## 2023-06-10 DIAGNOSIS — Z860101 Personal history of adenomatous and serrated colon polyps: Secondary | ICD-10-CM

## 2023-06-10 DIAGNOSIS — R12 Heartburn: Secondary | ICD-10-CM

## 2023-06-10 DIAGNOSIS — K219 Gastro-esophageal reflux disease without esophagitis: Secondary | ICD-10-CM

## 2023-06-10 DIAGNOSIS — K580 Irritable bowel syndrome with diarrhea: Secondary | ICD-10-CM | POA: Diagnosis not present

## 2023-06-10 DIAGNOSIS — K589 Irritable bowel syndrome without diarrhea: Secondary | ICD-10-CM

## 2023-06-10 NOTE — Progress Notes (Unsigned)
 Primary Care Physician:  Minus Amel, MD Primary Gastroenterologist:  Dr. Riley Cheadle  Pre-Procedure History & Physical: HPI:  Heather Raymond is a 68 y.o. female here for follow-up GERD and IBS D; doing very well ;  infrequent need for Levsin recently.  When she takes it it works very well very well.  GERD well-controlled on Protonix  40 g once daily.  Has rare intermittent protrusion of hemorrhoids.  She has asked about hemorrhoid banding out of curiosity;.  Not a major issue a she reports.  Past Medical History:  Diagnosis Date   Asthma    Hypertension    Hypothyroidism    PONV (postoperative nausea and vomiting)    Thyroid  disease     Past Surgical History:  Procedure Laterality Date   ABDOMINAL HYSTERECTOMY     APPENDECTOMY     BUNIONECTOMY     March 2014   COLONOSCOPY [name] 06/30/2012   Dr.Charita Lindenberger- adequate prep, normal rectum, scattered left-sided divertiucula 4mm polyp in the ascending segment o/w the remainder of the colon appeared normal. bx=benign lymphoid polyp   COLONOSCOPY [name] 08/06/2017   Dr. Riley Cheadle: diverticulosis. next tcs in 7 years.    CYSTOCELE REPAIR [name] 07/21/2018   Procedure: ANTERIOR REPAIR (CYSTOCELE) SLING CYSTOSCOPY;  Surgeon: Erman Hayward, MD;  Location: [facility];  Service: Urology;  Laterality: [name];   leocolonoscopy  04/29/2007   YNW:[id number] anal canal hemorrhoids/Left-sided diverticula/Flat polyp, mid sigmoid colon (hyperplastic), diminutive cecal polyp (tubular adenoma)    Prior to Admission medications   Medication Sig Start Date End Date Taking? Authorizing Provider  Artificial Tear Solution (SOOTHE XP OP) Place 1 drop into both eyes 2 (two) times daily as needed (for dry eyes).   Yes [provider]  Ascorbic Acid (VITAMIN C PO) Take by mouth daily.   Yes [provider]  calcium-vitamin D (OSCAL WITH D) 500-200 MG-UNIT tablet Take 1 tablet by mouth daily.   Yes [provider]  hyoscyamine  (LEVSIN/SL) 0.125 MG SL tablet  Place 1 tablet (0.125 mg total) under the tongue to dissolve 4 (four) times daily -  before meals and at bedtime. 09/23/22  Yes Terina Mcelhinny, Windsor Hatcher, MD  levothyroxine  (SYNTHROID ) 75 MCG tablet Take 1 tablet (75 mcg total) by mouth daily. 09/17/22  Yes   loratadine (CLARITIN) 10 MG tablet Take 10 mg by mouth daily.   Yes [provider]  Multiple Vitamin (MULTIVITAMIN) tablet Take 1 tablet by mouth daily.   Yes [provider]  nitrofurantoin , macrocrystal-monohydrate, (MACROBID ) 100 MG capsule Take 1 capsule (100 mg total) by mouth daily. 01/09/23  Yes   pantoprazole  (PROTONIX ) 40 MG tablet Take 1 tablet (40 mg total) by mouth daily. 09/23/22  Yes Laiken Sandy, Windsor Hatcher, MD  telmisartan  (MICARDIS ) 80 MG tablet Take 1 tablet (80 mg total) by mouth daily. 09/17/22  Yes     Allergies as of 06/10/2023 - Review Complete 06/10/2023  Allergen Reaction Noted   Other Anaphylaxis 12/03/2016   Sulfa antibiotics Rash 12/17/2011    Family History  Problem Relation Age of Onset   Allergic rhinitis Mother    Dementia Mother    Multiple myeloma Father        Deceased at age 51   Mental illness Sister        schizophrenia, Bipolar   Ankylosing spondylitis Sister    Eczema Sister    Hypertension Sister    Other Brother        broke neck in MVA   Arthritis Brother  Gallstones Brother    Cancer Maternal Grandmother        stomach   Gastric cancer Maternal Grandmother    Diabetes Maternal Grandfather    Heart disease Paternal Grandmother    Emphysema Paternal Grandfather    Obesity Daughter    Supraventricular tachycardia Daughter    Suicidality Son    Colon cancer Neg Hx    Angioedema Neg Hx    Asthma Neg Hx    Atopy Neg Hx    Immunodeficiency Neg Hx    Urticaria Neg Hx     Social History   Socioeconomic History   Marital status: Married    Spouse name: Not on file   Number of children: 2   Years of education: Not on file   Highest education level: Not on file  Occupational  History   Occupation: Retired Materials engineer: RETIRED  Tobacco Use   Smoking status: Never   Smokeless tobacco: Never  Vaping Use   Vaping status: Never Used  Substance and Sexual Activity   Alcohol use: No   Drug use: No   Sexual activity: Yes    Birth control/protection: Surgical    Comment: hyst  Other Topics Concern   Not on file  Social History Narrative   One child living. One son deceased secondary to accident.   Social Drivers of Corporate investment banker Strain: Low Risk  (06/15/2020)   Overall Financial Resource Strain (CARDIA)    Difficulty of Paying Living Expenses: Not hard at all  Food Insecurity: No Food Insecurity (06/15/2020)   Hunger Vital Sign    Worried About Running Out of Food in the Last Year: Never true    Ran Out of Food in the Last Year: Never true  Transportation Needs: No Transportation Needs (06/15/2020)   PRAPARE - Administrator, Civil Service (Medical): No    Lack of Transportation (Non-Medical): No  Physical Activity: Sufficiently Active (06/15/2020)   Exercise Vital Sign    Days of Exercise per Week: 5 days    Minutes of Exercise per Session: 60 min  Stress: No Stress Concern Present (06/15/2020)   [employer] of Occupational Health - Occupational Stress Questionnaire    Feeling of Stress : Not at all  Social Connections: Moderately Integrated (06/15/2020)   Social Connection and Isolation Panel [NHANES]    Frequency of Communication with Friends and Family: More than three times a week    Frequency of Social Gatherings with Friends and Family: More than three times a week    Attends Religious Services: More than 4 times per year    Active Member of [employer] or Organizations: No    Attends Banker Meetings: Never    Marital Status: Married  Catering manager Violence: Not At Risk (06/15/2020)   Humiliation, Afraid, Rape, and Kick questionnaire    Fear of Current or Ex-Partner: No    Emotionally  Abused: No    Physically Abused: No    Sexually Abused: No    Review of Systems: See HPI, otherwise negative ROS  Physical Exam: BP 131/85 (BP Location: Right Arm, Patient Position: Sitting, Cuff Size: Normal)   Pulse 79   Temp (!) 97.5 F (36.4 C) (Temporal)   Ht 5' 2.5" (1.588 m)   Wt 153 lb 3.2 oz (69.5 kg)   SpO2 97%   BMI 27.57 kg/m  General:   Alert,  Well-developed, well-nourished, pleasant and cooperative in NAD Neck:  Supple; no masses or thyromegaly. No significant cervical adenopathy. Lungs:  Clear throughout to auscultation.   No wheezes, crackles, or rhonchi. No acute distress. Heart:  Regular rate and rhythm; no murmurs, clicks, rubs,  or gallops. Abdomen: Non-distended, normal bowel sounds.  Soft and nontender without appreciable mass or hepatosplenomegaly.    Impression/Plan:   68 year old  lady with well-controlled GERD without alarm features returns the office today also with intermittent postprandial abdominal cramps and diarrhea consistent with IBS-D.  Symptoms are well-controlled at this time.  History of colonic adenoma; due for Sarahn's colonoscopy 2026.  Recommendations  Use Levsin sublingually on an as-needed basis   continue Protonix  40 mg daily for acid reflux.  Will plan to  back in 1 year to reassess and set up a surveillance colonoscopy (history of colon polyps).  Pamphlet on hemorrhoid banding   Notice: This dictation was prepared with Dragon dictation along with smaller phrase technology. Any transcriptional errors that result from this process are unintentional and may not be corrected upon review.

## 2023-06-10 NOTE — Patient Instructions (Addendum)
 Good to see you again today!  Use Levsin sublingually on an as-needed basis   continue Protonix  40 mg daily for acid reflux.  Will plan to see you back in 1 year to reassess and set up a surveillance colonoscopy (history of colon polyps).   pamphlet on hemorrhoid banding

## 2023-07-03 ENCOUNTER — Other Ambulatory Visit (HOSPITAL_COMMUNITY): Payer: Self-pay

## 2023-07-17 ENCOUNTER — Other Ambulatory Visit: Payer: Self-pay

## 2023-07-17 ENCOUNTER — Other Ambulatory Visit (HOSPITAL_COMMUNITY): Payer: Self-pay

## 2023-07-17 DIAGNOSIS — R350 Frequency of micturition: Secondary | ICD-10-CM | POA: Diagnosis not present

## 2023-07-17 MED ORDER — NITROFURANTOIN MACROCRYSTAL 100 MG PO CAPS
100.0000 mg | ORAL_CAPSULE | Freq: Every day | ORAL | 3 refills | Status: AC
  Filled 2023-07-17: qty 90, 90d supply, fill #0
  Filled 2023-12-13 – 2023-12-15 (×2): qty 90, 90d supply, fill #1
  Filled 2024-03-08: qty 90, 90d supply, fill #2

## 2023-07-30 ENCOUNTER — Other Ambulatory Visit (HOSPITAL_COMMUNITY): Payer: Self-pay

## 2023-08-06 DIAGNOSIS — E663 Overweight: Secondary | ICD-10-CM | POA: Diagnosis not present

## 2023-08-06 DIAGNOSIS — E063 Autoimmune thyroiditis: Secondary | ICD-10-CM | POA: Diagnosis not present

## 2023-08-06 DIAGNOSIS — Z6827 Body mass index (BMI) 27.0-27.9, adult: Secondary | ICD-10-CM | POA: Diagnosis not present

## 2023-08-26 ENCOUNTER — Other Ambulatory Visit (HOSPITAL_COMMUNITY): Payer: Self-pay

## 2023-09-01 DIAGNOSIS — N390 Urinary tract infection, site not specified: Secondary | ICD-10-CM | POA: Diagnosis not present

## 2023-09-01 DIAGNOSIS — Z6826 Body mass index (BMI) 26.0-26.9, adult: Secondary | ICD-10-CM | POA: Diagnosis not present

## 2023-09-01 DIAGNOSIS — Z0000 Encounter for general adult medical examination without abnormal findings: Secondary | ICD-10-CM | POA: Diagnosis not present

## 2023-09-01 DIAGNOSIS — K219 Gastro-esophageal reflux disease without esophagitis: Secondary | ICD-10-CM | POA: Diagnosis not present

## 2023-09-01 DIAGNOSIS — E039 Hypothyroidism, unspecified: Secondary | ICD-10-CM | POA: Diagnosis not present

## 2023-09-01 DIAGNOSIS — I10 Essential (primary) hypertension: Secondary | ICD-10-CM | POA: Diagnosis not present

## 2023-09-29 DIAGNOSIS — Z1321 Encounter for screening for nutritional disorder: Secondary | ICD-10-CM | POA: Diagnosis not present

## 2023-09-29 DIAGNOSIS — E039 Hypothyroidism, unspecified: Secondary | ICD-10-CM | POA: Diagnosis not present

## 2023-09-29 DIAGNOSIS — Z131 Encounter for screening for diabetes mellitus: Secondary | ICD-10-CM | POA: Diagnosis not present

## 2023-09-29 DIAGNOSIS — Z13220 Encounter for screening for lipoid disorders: Secondary | ICD-10-CM | POA: Diagnosis not present

## 2023-09-29 DIAGNOSIS — D559 Anemia due to enzyme disorder, unspecified: Secondary | ICD-10-CM | POA: Diagnosis not present

## 2023-09-29 DIAGNOSIS — Z1389 Encounter for screening for other disorder: Secondary | ICD-10-CM | POA: Diagnosis not present

## 2023-10-18 ENCOUNTER — Other Ambulatory Visit: Payer: Self-pay | Admitting: Internal Medicine

## 2023-10-21 ENCOUNTER — Other Ambulatory Visit (HOSPITAL_COMMUNITY): Payer: Self-pay

## 2023-10-21 ENCOUNTER — Other Ambulatory Visit: Payer: Self-pay

## 2023-10-21 MED ORDER — PANTOPRAZOLE SODIUM 40 MG PO TBEC
40.0000 mg | DELAYED_RELEASE_TABLET | Freq: Every day | ORAL | 11 refills | Status: AC
  Filled 2023-10-21: qty 30, 30d supply, fill #0

## 2023-10-23 ENCOUNTER — Encounter (HOSPITAL_COMMUNITY): Payer: Self-pay

## 2023-10-23 ENCOUNTER — Other Ambulatory Visit (HOSPITAL_COMMUNITY): Payer: Self-pay

## 2023-10-27 ENCOUNTER — Other Ambulatory Visit: Payer: Self-pay

## 2023-10-27 ENCOUNTER — Other Ambulatory Visit (HOSPITAL_COMMUNITY): Payer: Self-pay

## 2023-10-27 MED ORDER — LEVOTHYROXINE SODIUM 88 MCG PO TABS
88.0000 ug | ORAL_TABLET | Freq: Every morning | ORAL | 0 refills | Status: AC
Start: 2023-10-27 — End: ?
  Filled 2023-10-27: qty 90, 90d supply, fill #0

## 2023-10-28 ENCOUNTER — Other Ambulatory Visit (HOSPITAL_COMMUNITY): Payer: Self-pay

## 2023-11-05 DIAGNOSIS — L821 Other seborrheic keratosis: Secondary | ICD-10-CM | POA: Diagnosis not present

## 2023-11-05 DIAGNOSIS — D225 Melanocytic nevi of trunk: Secondary | ICD-10-CM | POA: Diagnosis not present

## 2023-11-18 ENCOUNTER — Other Ambulatory Visit (HOSPITAL_COMMUNITY): Payer: Self-pay

## 2023-11-19 ENCOUNTER — Other Ambulatory Visit (HOSPITAL_COMMUNITY): Payer: Self-pay

## 2023-11-19 ENCOUNTER — Other Ambulatory Visit: Payer: Self-pay

## 2023-11-19 MED ORDER — TELMISARTAN 80 MG PO TABS
80.0000 mg | ORAL_TABLET | Freq: Every day | ORAL | 1 refills | Status: AC
  Filled 2023-11-19: qty 90, 90d supply, fill #0
  Filled 2024-02-09: qty 90, 90d supply, fill #1

## 2023-12-13 ENCOUNTER — Other Ambulatory Visit (HOSPITAL_COMMUNITY): Payer: Self-pay

## 2023-12-15 ENCOUNTER — Other Ambulatory Visit (HOSPITAL_COMMUNITY): Payer: Self-pay

## 2023-12-15 ENCOUNTER — Encounter (HOSPITAL_COMMUNITY): Payer: Self-pay

## 2023-12-19 ENCOUNTER — Other Ambulatory Visit: Payer: Self-pay

## 2024-01-22 ENCOUNTER — Other Ambulatory Visit (HOSPITAL_COMMUNITY): Payer: Self-pay

## 2024-02-09 ENCOUNTER — Other Ambulatory Visit (HOSPITAL_BASED_OUTPATIENT_CLINIC_OR_DEPARTMENT_OTHER): Payer: Self-pay

## 2024-03-08 ENCOUNTER — Other Ambulatory Visit (HOSPITAL_COMMUNITY): Payer: Self-pay
